# Patient Record
Sex: Male | Born: 1975 | Race: White | Hispanic: No | Marital: Married | Smoking: Former smoker
Health system: Southern US, Community
[De-identification: ages and names within clinical notes are randomized; demographics above are authoritative.]

## PROBLEM LIST (undated history)

## (undated) DIAGNOSIS — F419 Anxiety disorder, unspecified: Secondary | ICD-10-CM

## (undated) HISTORY — PX: MANDIBLE SURGERY: SHX707

## (undated) HISTORY — PX: FOOT SURGERY: SHX648

---

## 2006-11-29 ENCOUNTER — Emergency Department (HOSPITAL_COMMUNITY): Admission: EM | Admit: 2006-11-29 | Discharge: 2006-11-29 | Payer: Self-pay | Admitting: Emergency Medicine

## 2007-03-14 ENCOUNTER — Emergency Department: Payer: Self-pay | Admitting: Internal Medicine

## 2007-04-09 ENCOUNTER — Emergency Department: Payer: Self-pay | Admitting: Emergency Medicine

## 2007-04-09 ENCOUNTER — Other Ambulatory Visit: Payer: Self-pay

## 2008-04-16 IMAGING — CR DG CHEST 2V
1 series · 2 of 2 positions shown · non-contrast
Comparison: none

REASON FOR EXAM: chest pain
COMMENTS:

PROCEDURE:     DXR - DXR CHEST PA (OR AP) AND LATERAL  - March 14, 2007  [DATE]
RESULT:     The lungs are well expanded. There is no focal infiltrate. The
mediastinum is not widened. I see no pleural effusion and no acute bony
abnormality.

[Series 1: view not recorded · 0.17mm/px · 2 of 2 slices shown]
[im 1/2]
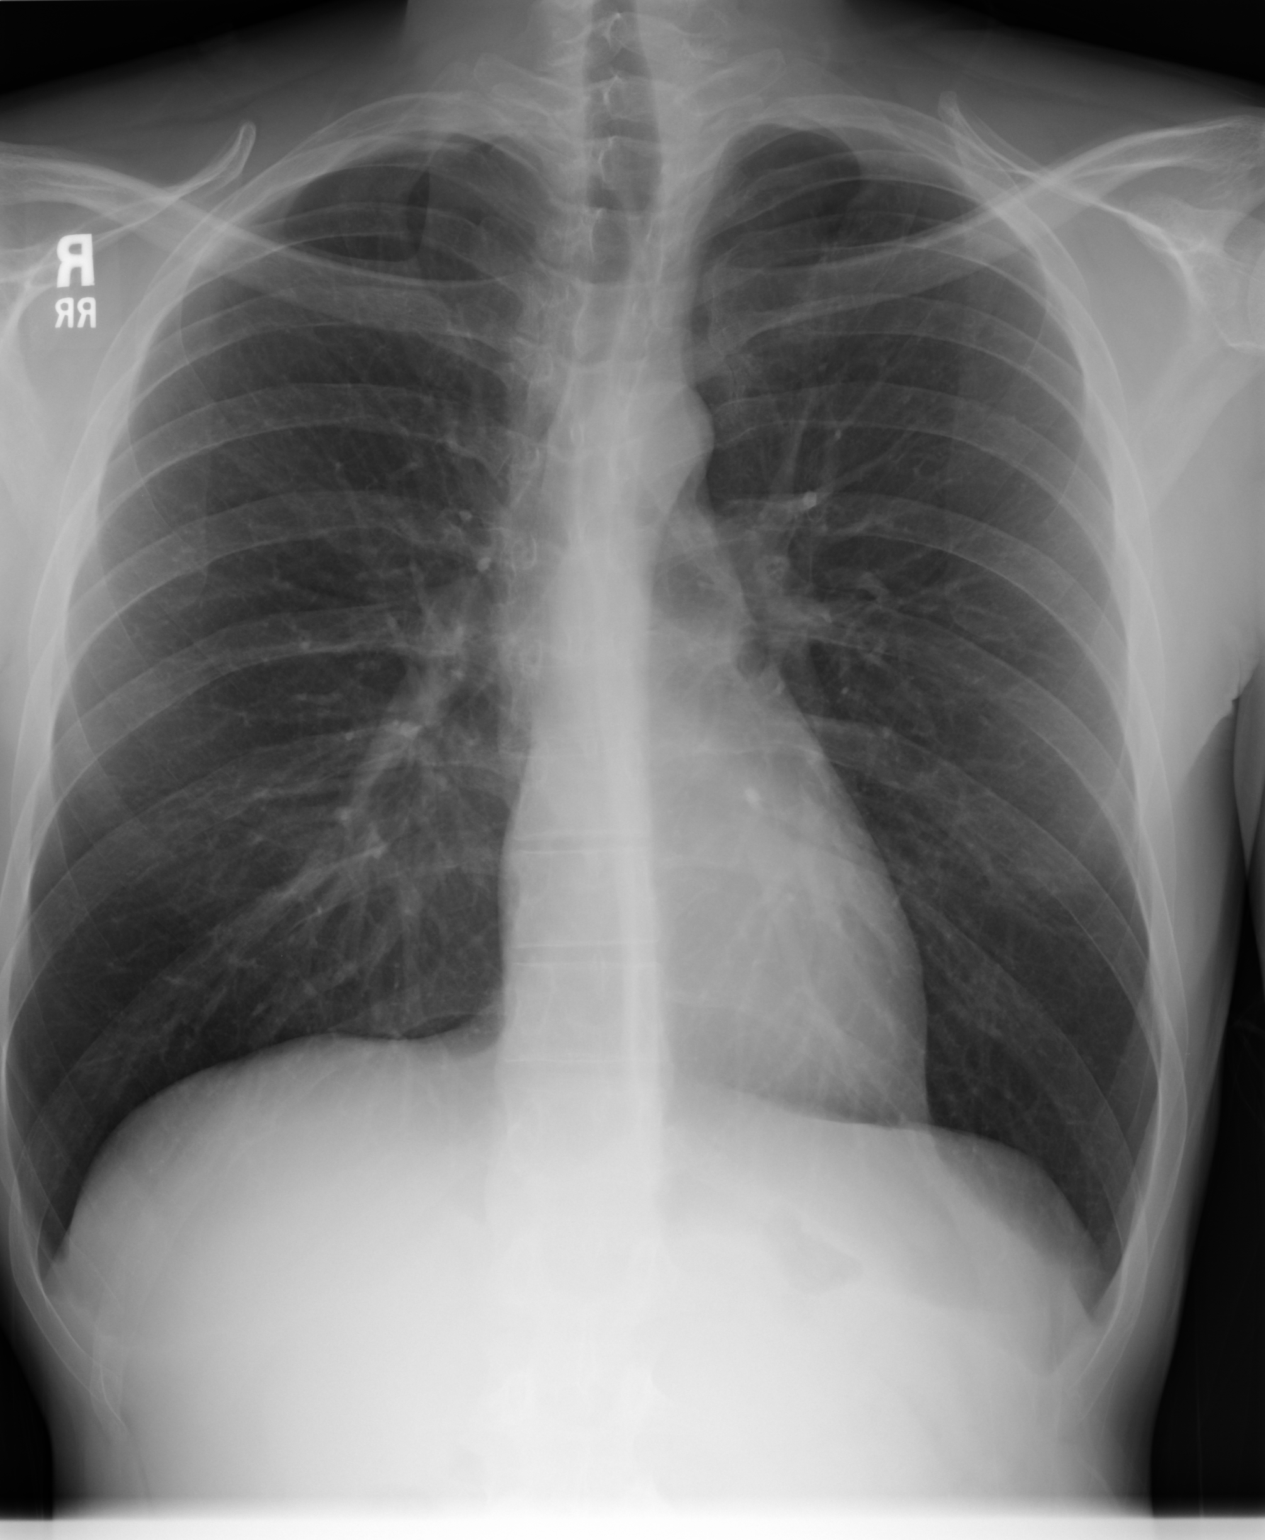
[im 2/2]
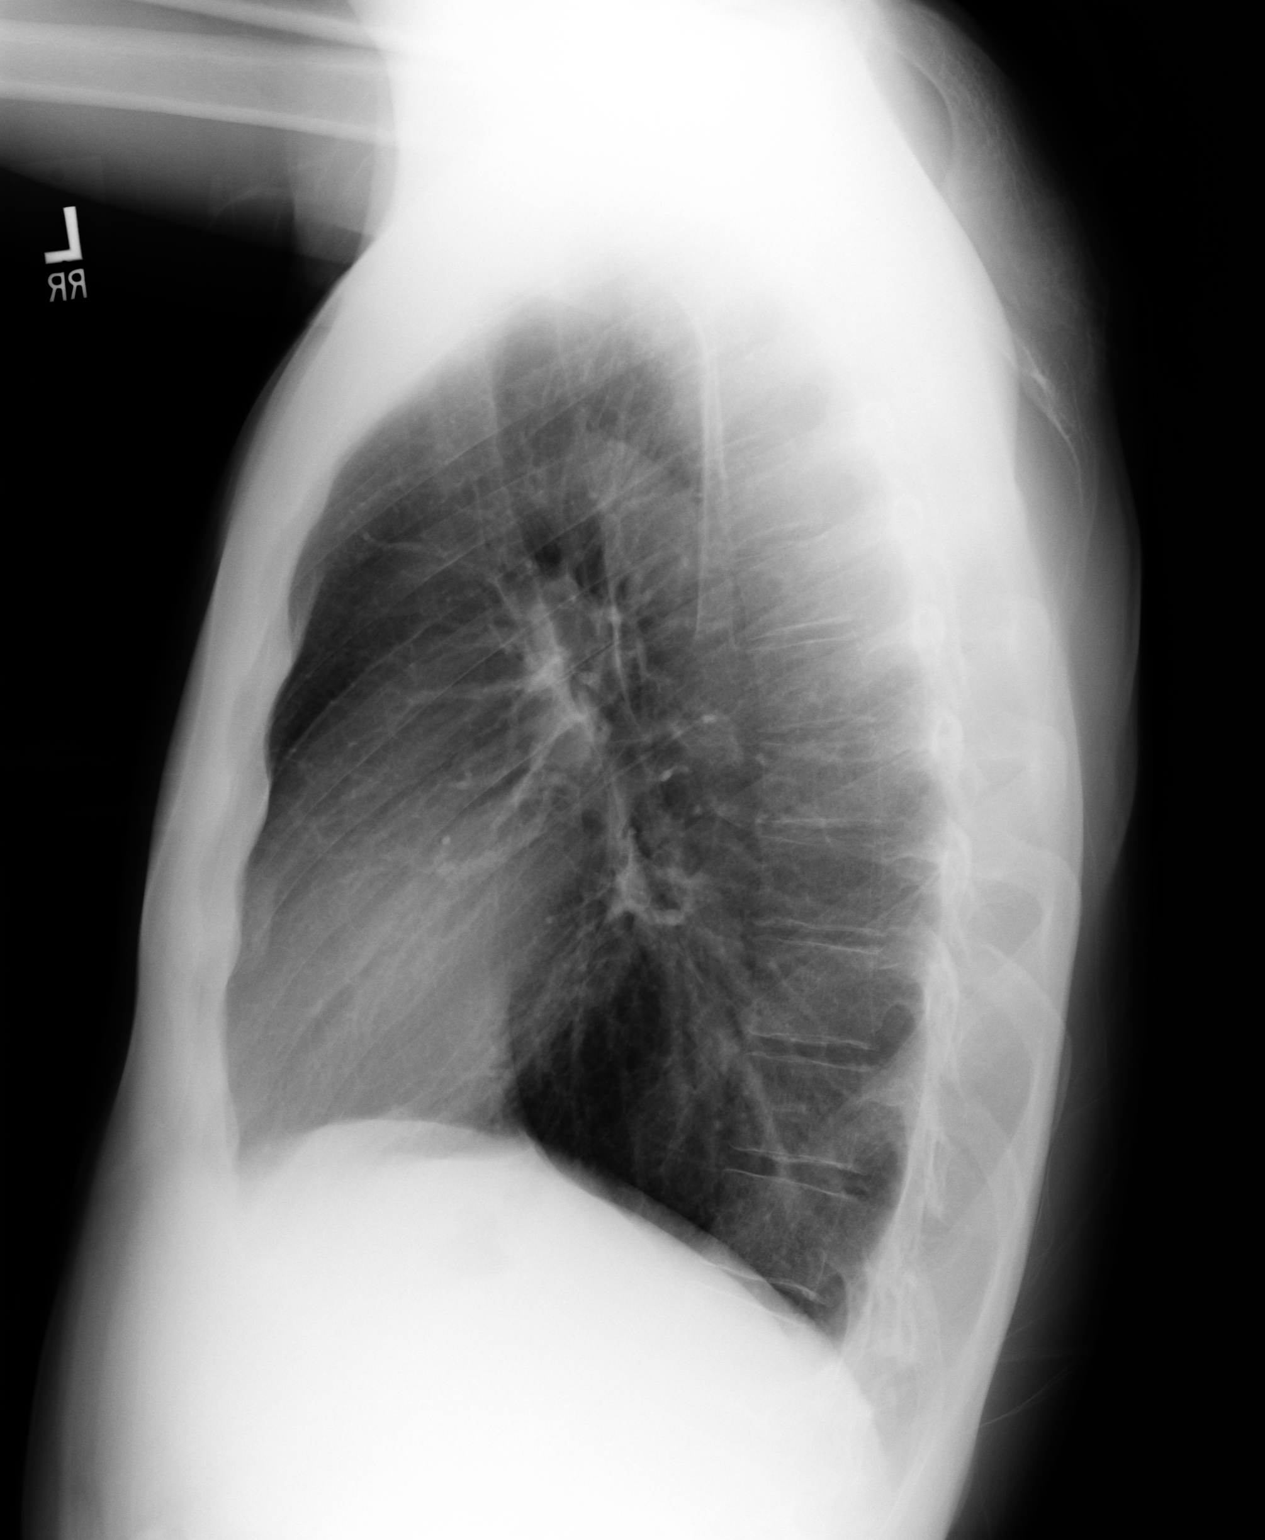

[2 of 2 positions shown; findings below may reference images not displayed]

IMPRESSION: 1.I do not see evidence of pneumonia nor pneumothorax or other acute
cardiopulmonary abnormality. If the patient's chest discomfort persists, a
follow-up CT scan may be of value.

## 2008-05-12 IMAGING — CR DG CHEST 2V
1 series · 2 of 2 positions shown · non-contrast
Comparison: none

REASON FOR EXAM: Chest pain
COMMENTS:   LMP: (Male)

[Series 1: view not recorded · 0.17mm/px · 2 of 2 slices shown]
[im 1/2]
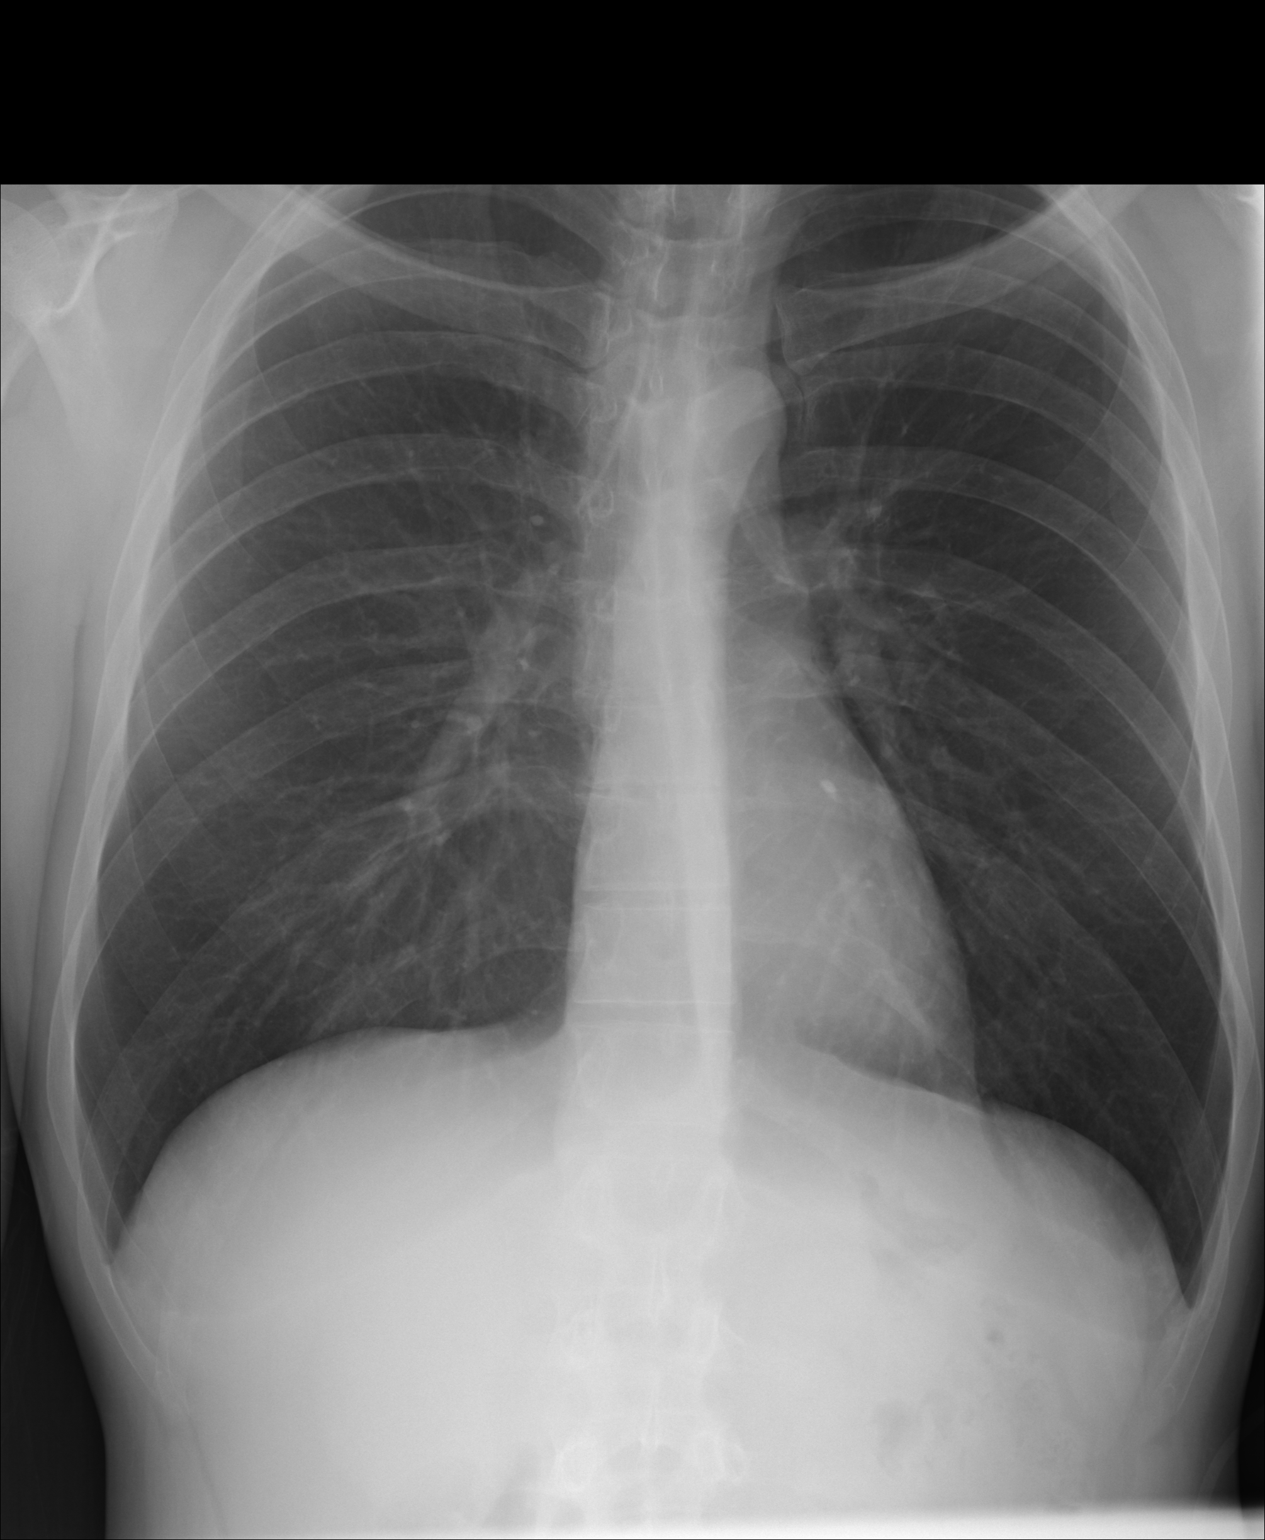
[im 2/2]
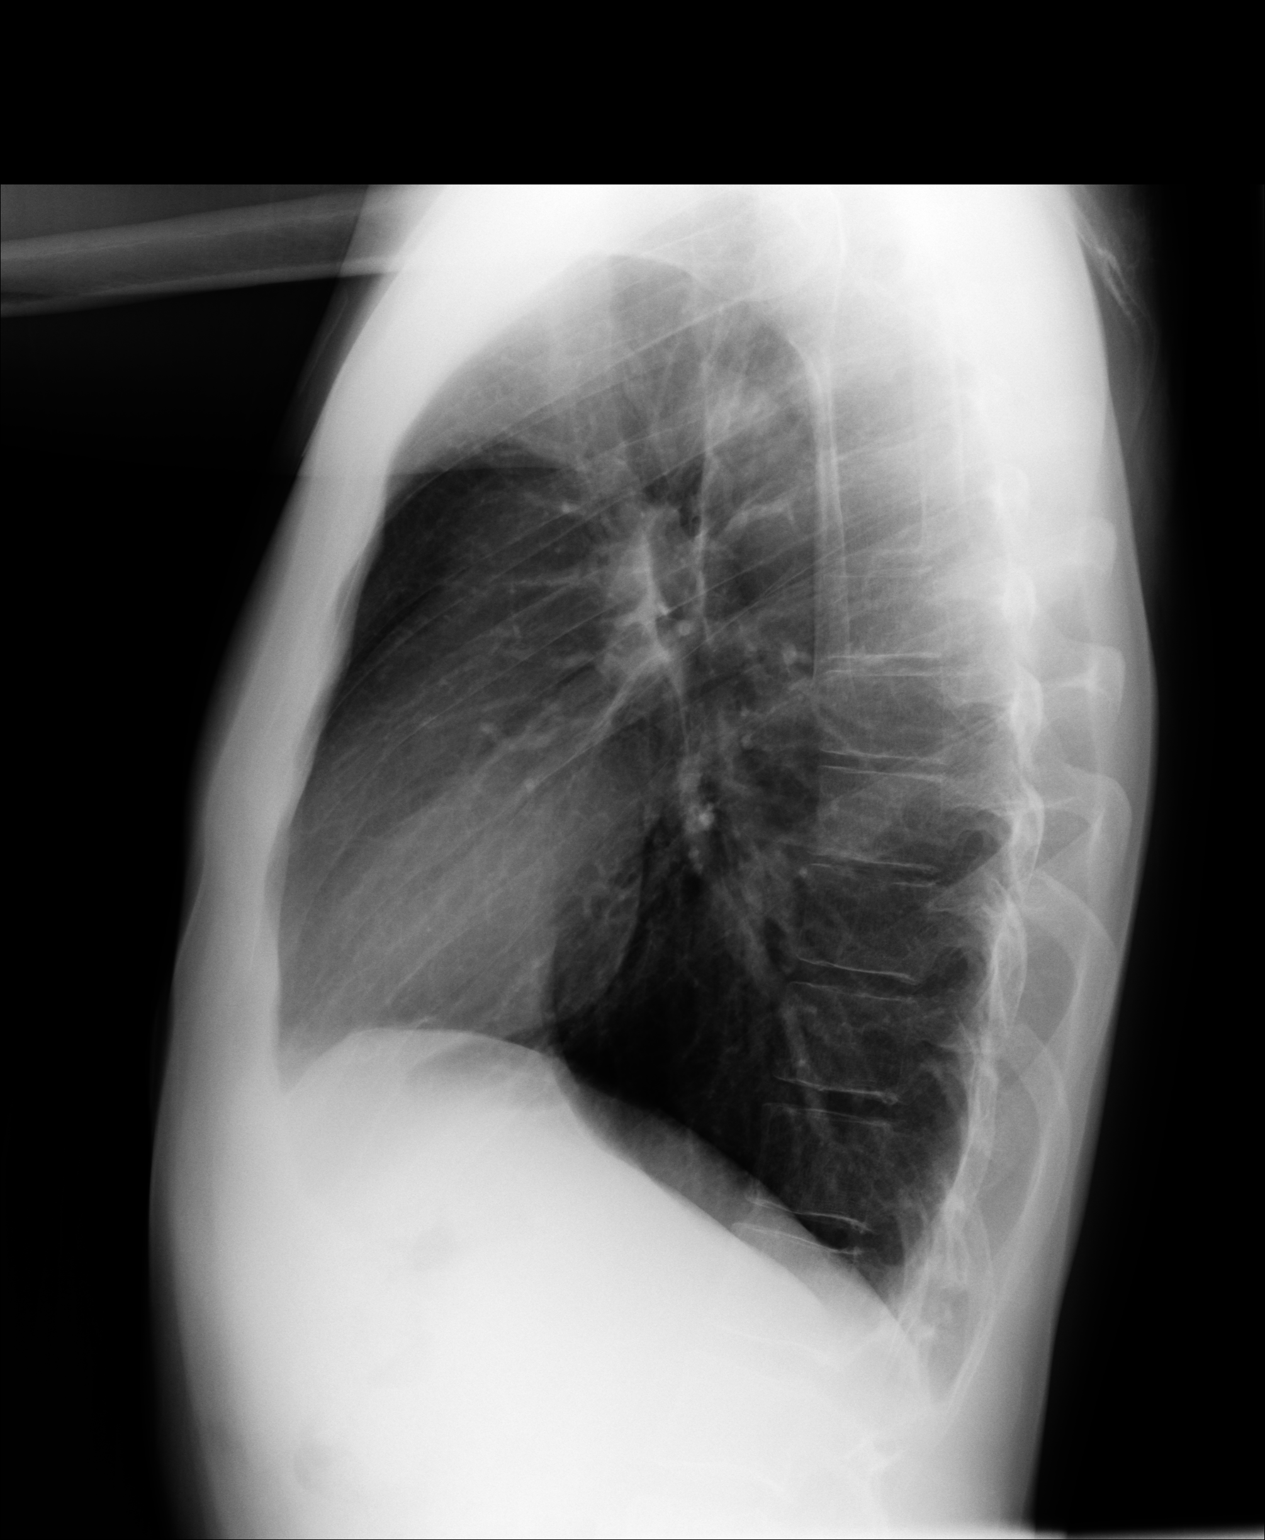

[2 of 2 positions shown; findings below may reference images not displayed]

PROCEDURE:     DXR - DXR CHEST PA (OR AP) AND LATERAL  - April 09, 2007  [DATE]

RESULT:     Comparison is made to a study of 03/14/2007.

The lungs are mildly hyperinflated. There is no focal infiltrate. There is
no evidence of pneumothorax or pleural effusion. The cardiac silhouette and
pulmonary vascularity are normal in appearance.
IMPRESSION: I do not see evidence of acute cardiopulmonary disease.
There has not been significant change since the study in March 2007.  As
noted earlier, chest CT scanning may be a useful next step.

## 2018-03-21 ENCOUNTER — Emergency Department (HOSPITAL_COMMUNITY)
Admission: EM | Admit: 2018-03-21 | Discharge: 2018-03-22 | Disposition: A | Discharge status: Home / Self Care | Payer: Self-pay | Attending: Emergency Medicine | Admitting: Emergency Medicine

## 2018-03-21 ENCOUNTER — Other Ambulatory Visit: Payer: Self-pay

## 2018-03-21 ENCOUNTER — Encounter (HOSPITAL_COMMUNITY): Payer: Self-pay | Admitting: Emergency Medicine

## 2018-03-21 DIAGNOSIS — F10230 Alcohol dependence with withdrawal, uncomplicated: Secondary | ICD-10-CM | POA: Insufficient documentation

## 2018-03-21 DIAGNOSIS — F10930 Alcohol use, unspecified with withdrawal, uncomplicated: Secondary | ICD-10-CM

## 2018-03-21 HISTORY — DX: Anxiety disorder, unspecified: F41.9

## 2018-03-21 LAB — CBC
HCT: 48.8 % (ref 39.0–52.0)
Hemoglobin: 17.4 g/dL — ABNORMAL HIGH (ref 13.0–17.0)
MCH: 33.6 pg (ref 26.0–34.0)
MCHC: 35.7 g/dL (ref 30.0–36.0)
MCV: 94.2 fL (ref 78.0–100.0)
Platelets: 230 10*3/uL (ref 150–400)
RBC: 5.18 MIL/uL (ref 4.22–5.81)
RDW: 12.4 % (ref 11.5–15.5)
WBC: 6.6 10*3/uL (ref 4.0–10.5)

## 2018-03-21 LAB — URINALYSIS, ROUTINE W REFLEX MICROSCOPIC
BACTERIA UA: NONE SEEN
BILIRUBIN URINE: NEGATIVE
GLUCOSE, UA: NEGATIVE mg/dL
KETONES UR: 20 mg/dL — AB
LEUKOCYTES UA: NEGATIVE
NITRITE: NEGATIVE
PH: 6 (ref 5.0–8.0)
Protein, ur: 30 mg/dL — AB
SPECIFIC GRAVITY, URINE: 1.008 (ref 1.005–1.030)
Squamous Epithelial / LPF: NONE SEEN

## 2018-03-21 LAB — COMPREHENSIVE METABOLIC PANEL
ALBUMIN: 4.8 g/dL (ref 3.5–5.0)
ALK PHOS: 54 U/L (ref 38–126)
ALT: 138 U/L — AB (ref 17–63)
AST: 233 U/L — AB (ref 15–41)
Anion gap: 21 — ABNORMAL HIGH (ref 5–15)
BILIRUBIN TOTAL: 1.8 mg/dL — AB (ref 0.3–1.2)
BUN: 9 mg/dL (ref 6–20)
CALCIUM: 9.8 mg/dL (ref 8.9–10.3)
CO2: 25 mmol/L (ref 22–32)
Chloride: 86 mmol/L — ABNORMAL LOW (ref 101–111)
Creatinine, Ser: 0.9 mg/dL (ref 0.61–1.24)
GFR calc Af Amer: >60 mL/min (ref >60)
GLUCOSE: 105 mg/dL — AB (ref 65–99)
Potassium: 4.1 mmol/L (ref 3.5–5.1)
Sodium: 132 mmol/L — ABNORMAL LOW (ref 135–145)
TOTAL PROTEIN: 8.5 g/dL — AB (ref 6.5–8.1)

## 2018-03-21 LAB — LIPASE, BLOOD: Lipase: 32 U/L (ref 11–51)

## 2018-03-21 MED ORDER — SODIUM CHLORIDE 0.9 % IV BOLUS (SEPSIS)
1000.0000 mL | Freq: Once | INTRAVENOUS | Status: AC
  Administered 2018-03-21: 1000 mL via INTRAVENOUS

## 2018-03-21 MED ORDER — LORAZEPAM 2 MG/ML IJ SOLN
1.0000 mg | Freq: Once | INTRAMUSCULAR | Status: AC
  Administered 2018-03-21: 1 mg via INTRAVENOUS
  Filled 2018-03-21: qty 1

## 2018-03-21 MED ORDER — PROMETHAZINE HCL 25 MG/ML IJ SOLN
25.0000 mg | Freq: Once | INTRAMUSCULAR | Status: AC
  Administered 2018-03-21: 25 mg via INTRAVENOUS
  Filled 2018-03-21: qty 1

## 2018-03-21 MED ORDER — CHLORDIAZEPOXIDE HCL 25 MG PO CAPS: ORAL_CAPSULE | ORAL | 0 refills | Status: DC

## 2018-03-21 MED ORDER — ONDANSETRON 4 MG PO TBDP
4.0000 mg | ORAL_TABLET | Freq: Once | ORAL | Status: AC | PRN
  Administered 2018-03-21: 4 mg via ORAL
  Filled 2018-03-21: qty 1

## 2018-03-21 MED ORDER — SODIUM CHLORIDE 0.9 % IV SOLN: 1000.0000 mL | INTRAVENOUS | Status: DC

## 2018-03-21 MED ORDER — PROMETHAZINE HCL 25 MG PO TABS: 25.0000 mg | ORAL_TABLET | Freq: Four times a day (QID) | ORAL | 0 refills | Status: DC | PRN

## 2018-03-21 NOTE — Discharge Instructions (Addendum)
Please review the discharge instructions to help you find a treatment center, follow up with a primary care doctor in 1-2 weeks to be rechecked, return as needed for worsening symptoms

## 2018-03-21 NOTE — ED Triage Notes (Signed)
Pt reports he has anxiety and tried began to drink more alcohol X4 days.  He has been unable to keep anything down for 2 days despite OTC medications.  Pt reports he is dehydrated.  Last drink today

## 2018-03-21 NOTE — ED Provider Notes (Signed)
[facility] EMERGENCY DEPARTMENT Provider Note   CSN: [id number] Arrival date & time: 03/21/18  1919     History   Chief Complaint Chief Complaint  Patient presents with  . Emesis  . Dehydration    HPI Seth Chavez is a 42 y.o. male.  HPI Pt has been having nausea and vomiting for the last several days.  He has tried over the counter mediations but it has not helped.  Pt drinks alcohol daily.  Pt usually drinks a few beers per day but started drinking more.   Pt starts to get shaky if he stops drinking alcohol .  No diarrhea.  No fever.    No abdominal pain but his throat is sore from the vomiting. Past Medical History:  Diagnosis Date  . Anxiety       History reviewed. No pertinent surgical history.      Home Medications    Prior to Admission medications   Medication Sig Start Date End Date Taking? Authorizing Provider  chlordiazePOXIDE (LIBRIUM) 25 MG capsule 50mg  PO TID x 1D, then 25-50mg  PO BID X 1D, then 25-50mg  PO QD X 1D 03/21/18   Linwood Dibbles, MD  promethazine (PHENERGAN) 25 MG tablet Take 1 tablet (25 mg total) by mouth every 6 (six) hours as needed for nausea or vomiting. 03/21/18   Linwood Dibbles, MD    Family History History reviewed. No pertinent family history.  Social History Social History   Tobacco Use  . Smoking status: Not on file  Substance Use Topics  . Alcohol use: Yes    Comment: daily drinker  . Drug use: Not on file     Allergies   Patient has no known allergies.   Review of Systems Review of Systems  All other systems reviewed and are negative.    Physical Exam Updated Vital Signs BP (!) 138/95   Pulse (!) 105   Temp 98.7 F (37.1 C) (Oral)   Resp 15   Ht 1.778 m (5\' 10" )   Wt 63.5 kg (140 lb)   SpO2 94%   BMI 20.09 kg/m   Physical Exam  Constitutional: He appears well-developed and well-nourished. No distress.  HENT:  Head: Normocephalic and atraumatic.  Right Ear: External ear normal.  Left  Ear: External ear normal.  Eyes: Conjunctivae are normal. Right eye exhibits no discharge. Left eye exhibits no discharge. No scleral icterus.  Neck: Neck supple. No tracheal deviation present.  Cardiovascular: Regular rhythm and intact distal pulses. Tachycardia present.  Pulmonary/Chest: Effort normal and breath sounds normal. No stridor. No respiratory distress. He has no wheezes. He has no rales.  Abdominal: Soft. Bowel sounds are normal. He exhibits no distension. There is no tenderness. There is no rebound and no guarding.  Musculoskeletal: He exhibits no edema or tenderness.  Neurological: He is alert. He has normal strength. He displays tremor. No cranial nerve deficit (no facial droop, extraocular movements intact, no slurred speech) or sensory deficit. He exhibits normal muscle tone. He displays no seizure activity. Coordination normal.  Skin: Skin is warm and dry. No rash noted.  Psychiatric: He has a normal mood and affect.  Nursing note and vitals reviewed.    ED Treatments / Results  Labs (all labs ordered are listed, but only abnormal results are displayed) Labs Reviewed  COMPREHENSIVE METABOLIC PANEL - Abnormal; Notable for the following components:      Result Value   Sodium 132 (*)    Chloride 86 (*)  Glucose, Bld 105 (*)    Total Protein 8.5 (*)    AST 233 (*)    ALT 138 (*)    Total Bilirubin 1.8 (*)    Anion gap 21 (*)    All other components within normal limits  CBC - Abnormal; Notable for the following components:   Hemoglobin 17.4 (*)    All other components within normal limits  URINALYSIS, ROUTINE W REFLEX MICROSCOPIC - Abnormal; Notable for the following components:   Hgb urine dipstick SMALL (*)    Ketones, ur 20 (*)    Protein, ur 30 (*)    All other components within normal limits  LIPASE, BLOOD    Procedures Procedures (including critical care time)  Medications Ordered in ED Medications  sodium chloride 0.9 % bolus 1,000 mL (0 mLs  Intravenous Stopped 03/21/18 2328)    Followed by  sodium chloride 0.9 % bolus 1,000 mL (1,000 mLs Intravenous New Bag/Given 03/21/18 2328)    Followed by  0.9 %  sodium chloride infusion (has no administration in time range)  ondansetron (ZOFRAN-ODT) disintegrating tablet 4 mg (4 mg Oral Given 03/21/18 1933)  LORazepam (ATIVAN) injection 1 mg (1 mg Intravenous Given 03/21/18 2251)  promethazine (PHENERGAN) injection 25 mg (25 mg Intravenous Given 03/21/18 2251)     Initial Impression / Assessment and Plan / ED Course  I have reviewed the triage vital signs and the nursing notes.  Pertinent labs & imaging results that were available during my care of the patient were reviewed by me and considered in my medical decision making (see chart for details).  Clinical Course as of Mar 21 2353  Sat Mar 21, 2018  2216 Low  Chloride(!): 86 [JK]  2216 LFTs elevated  Comprehensive metabolic panel(!) [JK]    Clinical Course User Index [JK] Linwood DibblesKnapp, Quincey Nored, MD    Patient presented to the emergency room for evaluation of nausea vomiting associated with chronic alcohol use.  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  Patient was treated with IV fluids and benzodiazepines.  Patient's symptoms have improved significantly.  He is no longer tremulous.  He is tolerating fluids.  We discussed outpatient treatment for alcohol detox.  Plan on discharge home with prescription for Phenergan and Librium to help him with his withdrawal symptoms.  Warning signs and precautions discussed. Final Clinical Impressions(s) / ED Diagnoses   Final diagnoses:  Alcohol withdrawal syndrome without complication [facility](H[facility])    ED Discharge Orders        Ordered    promethazine (PHENERGAN) 25 MG tablet  Every 6 hours PRN     03/21/18 2347    chlordiazePOXIDE (LIBRIUM) 25 MG capsule     03/21/18 2347       Linwood DibblesKnapp, Grenda Lora, MD 03/21/18 2355

## 2018-06-10 ENCOUNTER — Encounter (HOSPITAL_COMMUNITY): Payer: Self-pay | Admitting: Emergency Medicine

## 2018-06-10 ENCOUNTER — Other Ambulatory Visit: Payer: Self-pay

## 2018-06-10 ENCOUNTER — Emergency Department (HOSPITAL_COMMUNITY)
Admission: EM | Admit: 2018-06-10 | Discharge: 2018-06-11 | Disposition: A | Discharge status: Home / Self Care | Payer: Managed Care, Other (non HMO) | Attending: Emergency Medicine | Admitting: Emergency Medicine

## 2018-06-10 DIAGNOSIS — Z79899 Other long term (current) drug therapy: Secondary | ICD-10-CM | POA: Diagnosis not present

## 2018-06-10 DIAGNOSIS — R112 Nausea with vomiting, unspecified: Secondary | ICD-10-CM | POA: Diagnosis present

## 2018-06-10 DIAGNOSIS — F1010 Alcohol abuse, uncomplicated: Secondary | ICD-10-CM | POA: Diagnosis not present

## 2018-06-10 DIAGNOSIS — F419 Anxiety disorder, unspecified: Secondary | ICD-10-CM | POA: Diagnosis not present

## 2018-06-10 DIAGNOSIS — R1115 Cyclical vomiting syndrome unrelated to migraine: Secondary | ICD-10-CM

## 2018-06-10 LAB — COMPREHENSIVE METABOLIC PANEL
ALT: 174 U/L — ABNORMAL HIGH (ref 0–44)
ANION GAP: 20 — AB (ref 5–15)
AST: 207 U/L — ABNORMAL HIGH (ref 15–41)
Albumin: 4.4 g/dL (ref 3.5–5.0)
Alkaline Phosphatase: 43 U/L (ref 38–126)
BILIRUBIN TOTAL: 1.3 mg/dL — AB (ref 0.3–1.2)
BUN: 6 mg/dL (ref 6–20)
CO2: 23 mmol/L (ref 22–32)
Calcium: 9.4 mg/dL (ref 8.9–10.3)
Chloride: 91 mmol/L — ABNORMAL LOW (ref 98–111)
Creatinine, Ser: 0.92 mg/dL (ref 0.61–1.24)
Glucose, Bld: 134 mg/dL — ABNORMAL HIGH (ref 70–99)
POTASSIUM: 4 mmol/L (ref 3.5–5.1)
Sodium: 134 mmol/L — ABNORMAL LOW (ref 135–145)
TOTAL PROTEIN: 7.8 g/dL (ref 6.5–8.1)

## 2018-06-10 LAB — CBC
HEMATOCRIT: 50.2 % (ref 39.0–52.0)
HEMOGLOBIN: 17.2 g/dL — AB (ref 13.0–17.0)
MCH: 33 pg (ref 26.0–34.0)
MCHC: 34.3 g/dL (ref 30.0–36.0)
MCV: 96.2 fL (ref 78.0–100.0)
Platelets: 197 10*3/uL (ref 150–400)
RBC: 5.22 MIL/uL (ref 4.22–5.81)
RDW: 11.6 % (ref 11.5–15.5)
WBC: 6.3 10*3/uL (ref 4.0–10.5)

## 2018-06-10 LAB — URINALYSIS, ROUTINE W REFLEX MICROSCOPIC
BILIRUBIN URINE: NEGATIVE
Glucose, UA: NEGATIVE mg/dL
HGB URINE DIPSTICK: NEGATIVE
Ketones, ur: 15 mg/dL — AB
Leukocytes, UA: NEGATIVE
Nitrite: NEGATIVE
PROTEIN: NEGATIVE mg/dL
Specific Gravity, Urine: <1.005 — ABNORMAL LOW (ref 1.005–1.030)
pH: 5.5 (ref 5.0–8.0)

## 2018-06-10 LAB — LIPASE, BLOOD: Lipase: 32 U/L (ref 11–51)

## 2018-06-10 MED ORDER — ONDANSETRON 4 MG PO TBDP
4.0000 mg | ORAL_TABLET | Freq: Once | ORAL | Status: AC | PRN
  Administered 2018-06-10: 4 mg via ORAL
  Filled 2018-06-10: qty 1

## 2018-06-10 MED ORDER — LORAZEPAM 2 MG/ML IJ SOLN
0.5000 mg | Freq: Once | INTRAMUSCULAR | Status: AC
  Administered 2018-06-10: 0.5 mg via INTRAVENOUS
  Filled 2018-06-10: qty 1

## 2018-06-10 MED ORDER — SODIUM CHLORIDE 0.9 % IV BOLUS
1000.0000 mL | Freq: Once | INTRAVENOUS | Status: AC
  Administered 2018-06-10: 1000 mL via INTRAVENOUS

## 2018-06-10 MED ORDER — ONDANSETRON HCL 4 MG/2ML IJ SOLN
4.0000 mg | Freq: Once | INTRAMUSCULAR | Status: AC
  Administered 2018-06-10: 4 mg via INTRAVENOUS
  Filled 2018-06-10: qty 2

## 2018-06-10 NOTE — ED Notes (Signed)
Results reviewed.  No changes in acuity at this time 

## 2018-06-10 NOTE — ED Provider Notes (Signed)
[facility] EMERGENCY DEPARTMENT Provider Note   CSN: [id number] Arrival date & time: 06/10/18  1832     History   Chief Complaint Chief Complaint  Patient presents with  . Emesis    HPI Seth Chavez is a 42 y.o. male.  HPI   Seth Chavez is a 42 y.o. male, with a history of anxiety, presenting to the ED with nausea and vomiting for last two days. Has had four episodes of nonbloody, nonbilious emesis in last 24 hours.   States he will have these symptoms when he drinks too much, especially if he starts drinking hard liquor, which he did two days ago. Drinks about 2-6 beers a day. Last beer was around 4-5pm on 7/10. Denies illicit drug use.  Denies fever/chills, diarrhea, abdominal pain, chest pain, SOB, seizures, urinary symptoms, or any other complaints.      Past Medical History:  Diagnosis Date  . Anxiety     There are no active problems to display for this patient.   History reviewed. No pertinent surgical history.      Home Medications    Prior to Admission medications   Medication Sig Start Date End Date Taking? Authorizing Provider  chlordiazePOXIDE (LIBRIUM) 25 MG capsule 50mg  PO TID x 1D, then 25-50mg  PO BID X 1D, then 25-50mg  PO QD X 1D 06/11/18   Joy, Shawn C, PA-C  promethazine (PHENERGAN) 25 MG tablet Take 1 tablet (25 mg total) by mouth every 6 (six) hours as needed for nausea or vomiting. 06/11/18   Joy, Hillard Danker, PA-C    Family History History reviewed. No pertinent family history.  Social History Social History   Tobacco Use  . Smoking status: Never Smoker  . Smokeless tobacco: Never Used  Substance Use Topics  . Alcohol use: Yes    Comment: daily drinker  . Drug use: Never     Allergies   Patient has no known allergies.   Review of Systems Review of Systems  Constitutional: Negative for chills, diaphoresis and fever.  Respiratory: Negative for shortness of breath.   Cardiovascular: Negative for chest pain.    Gastrointestinal: Positive for nausea and vomiting. Negative for abdominal pain, blood in stool and diarrhea.  Genitourinary: Negative for dysuria and hematuria.  Neurological: Negative for dizziness, tremors, seizures, syncope, weakness, light-headedness, numbness and headaches.  All other systems reviewed and are negative.    Physical Exam Updated Vital Signs BP (!) 132/94 (BP Location: Right Arm)   Pulse 97   Temp 98.6 F (37 C) (Oral)   Resp 16   Ht 5\' 10"  (1.778 m)   Wt 63.5 kg (140 lb)   SpO2 95%   BMI 20.09 kg/m   Physical Exam  Constitutional: He is oriented to person, place, and time. He appears well-developed and well-nourished. No distress.  HENT:  Head: Normocephalic and atraumatic.  Eyes: Pupils are equal, round, and reactive to light. Conjunctivae and EOM are normal.  Neck: Neck supple.  Cardiovascular: Normal rate, regular rhythm, normal heart sounds and intact distal pulses.  Pulmonary/Chest: Effort normal and breath sounds normal. No respiratory distress.  Abdominal: Soft. There is no tenderness. There is no guarding.  Musculoskeletal: He exhibits no edema.  Lymphadenopathy:    He has no cervical adenopathy.  Neurological: He is alert and oriented to person, place, and time. He displays no tremor.  Sensation grossly intact to light touch in all four extremities. Strength 5/5 in all extremities. No gait disturbance. Coordination intact.  Cranial nerves III-XII grossly intact. No facial droop.   Skin: Skin is warm and dry. He is not diaphoretic.  Psychiatric: He has a normal mood and affect. His behavior is normal.  Nursing note and vitals reviewed.    ED Treatments / Results  Labs (all labs ordered are listed, but only abnormal results are displayed) Labs Reviewed  COMPREHENSIVE METABOLIC PANEL - Abnormal; Notable for the following components:      Result Value   Sodium 134 (*)    Chloride 91 (*)    Glucose, Bld 134 (*)    AST 207 (*)    ALT 174 (*)     Total Bilirubin 1.3 (*)    Anion gap 20 (*)    All other components within normal limits  CBC - Abnormal; Notable for the following components:   Hemoglobin 17.2 (*)    All other components within normal limits  URINALYSIS, ROUTINE W REFLEX MICROSCOPIC - Abnormal; Notable for the following components:   Specific Gravity, Urine <1.005 (*)    Ketones, ur 15 (*)    All other components within normal limits  RAPID URINE DRUG SCREEN, HOSP PERFORMED - Abnormal; Notable for the following components:   Barbiturates   (*)    Value: Result not available. Reagent lot number recalled by manufacturer.   All other components within normal limits  ETHANOL - Abnormal; Notable for the following components:   Alcohol, Ethyl (B) 358 (*)    All other components within normal limits  LIPASE, BLOOD  HEPATITIS PANEL, ACUTE    EKG None  Radiology No results found.  Procedures Procedures (including critical care time)  Medications Ordered in ED Medications  ondansetron (ZOFRAN-ODT) disintegrating tablet 4 mg (4 mg Oral Given 06/10/18 1847)  sodium chloride 0.9 % bolus 1,000 mL (0 mLs Intravenous Stopped 06/11/18 0251)  ondansetron (ZOFRAN) injection 4 mg (4 mg Intravenous Given 06/10/18 2330)  LORazepam (ATIVAN) injection 0.5 mg (0.5 mg Intravenous Given 06/10/18 2330)  promethazine (PHENERGAN) tablet 12.5 mg (12.5 mg Oral Given 06/11/18 0200)     Initial Impression / Assessment and Plan / ED Course  I have reviewed the triage vital signs and the nursing notes.  Pertinent labs & imaging results that were available during my care of the patient were reviewed by me and considered in my medical decision making (see chart for details).  Clinical Course as of Jun 12 515  Thu Jun 11, 2018  0155 Patient states he continues to feel nauseous, but this has improved.   [SJ]    Clinical Course User Index [SJ] Joy, Shawn C, PA-C    Patient presents with nausea and vomiting.  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  Abdominal exam benign.  Some lab abnormalities consistent with dehydration.  Transaminase elevations consistent with previous values.  We were able to obtain good control of patient's nausea.  He was able to tolerate PO. The patient was given instructions for home care as well as return precautions. Patient voices understanding of these instructions, accepts the plan, and is comfortable with discharge.  Findings and plan of care discussed with Zadie Rhine, MD.   Vitals:   06/11/18 0300 06/11/18 0330 06/11/18 0345 06/11/18 0445  BP: (!) 125/91 115/83 115/83 119/88  Pulse: (!) 107 90 89 97  Resp:      Temp:      TempSrc:      SpO2: 94% 94% 96% 96%  Weight:      Height:  Final Clinical Impressions(s) / ED Diagnoses   Final diagnoses:  Non-intractable cyclical vomiting with nausea    ED Discharge Orders        Ordered    chlordiazePOXIDE (LIBRIUM) 25 MG capsule     06/11/18 0359    promethazine (PHENERGAN) 25 MG tablet  Every 6 hours PRN     06/11/18 0359       Anselm PancoastJoy, Shawn C, PA-C 06/11/18 0518    Zadie RhineWickline, Donald, MD 06/11/18 [phone]

## 2018-06-10 NOTE — ED Notes (Signed)
Pt stared at pill in cup for a bit before taking it.  This RN asked if patient did not want to take it.  Patient then states "may I have some water", and this RN explained again that tablet was meant to dissolve under his tongue.  Patient then took pill and swallowed whole.  Patient then asked for something for anxiety.  This RN explained that she was unable to give him anything in triage, but that he could speak to MD about it once roomed.  Patient verbalized understanding.

## 2018-06-10 NOTE — ED Triage Notes (Signed)
Pt presents to ED for assessment of nausea and emesis x 2 days, denies abdominal pain.  Pt states he has vomited 2 times in the last 24 hours.  States he is a daily drinker and has had four beers.  Pt states last drink 2 hours ago.  Patient c/o feeling dehydrated.   Pt c/o dizziness.  Denies any pain.  Denies diarrhea, denies urinary symptoms.

## 2018-06-11 LAB — RAPID URINE DRUG SCREEN, HOSP PERFORMED
Amphetamines: NOT DETECTED
Benzodiazepines: NOT DETECTED
COCAINE: NOT DETECTED
OPIATES: NOT DETECTED
TETRAHYDROCANNABINOL: NOT DETECTED

## 2018-06-11 LAB — ETHANOL: ALCOHOL ETHYL (B): 358 mg/dL — AB (ref <10)

## 2018-06-11 MED ORDER — PROMETHAZINE HCL 25 MG PO TABS: 25.0000 mg | ORAL_TABLET | Freq: Four times a day (QID) | ORAL | 0 refills | Status: DC | PRN

## 2018-06-11 MED ORDER — PROMETHAZINE HCL 25 MG PO TABS
12.5000 mg | ORAL_TABLET | Freq: Once | ORAL | Status: AC
  Administered 2018-06-11: 12.5 mg via ORAL
  Filled 2018-06-11: qty 1

## 2018-06-11 MED ORDER — CHLORDIAZEPOXIDE HCL 25 MG PO CAPS: ORAL_CAPSULE | ORAL | 0 refills | Status: DC

## 2018-06-11 NOTE — ED Notes (Signed)
Patient given PO fluids, tolerating well.  

## 2018-06-11 NOTE — ED Notes (Signed)
Pt discharged from ED; instructions provided and scripts given; Pt encouraged to return to ED if symptoms worsen and to f/u with PCP; Pt verbalized understanding of all instructions 

## 2018-06-11 NOTE — Discharge Instructions (Addendum)
Take the Librium taper, as directed, to avoid alcohol withdrawal symptoms. May use the promethazine, as needed, for nausea/vomiting. Follow-up with one of the resources on the attached list for alcohol recovery. Return to the ED as needed.

## 2018-06-12 LAB — HEPATITIS PANEL, ACUTE
HCV Ab: <0.1 s/co ratio (ref 0.0–0.9)
HEP B S AG: NEGATIVE
Hep A IgM: NEGATIVE
Hep B C IgM: NEGATIVE

## 2018-12-28 ENCOUNTER — Encounter (HOSPITAL_COMMUNITY): Payer: Self-pay

## 2018-12-28 ENCOUNTER — Other Ambulatory Visit: Payer: Self-pay

## 2018-12-28 ENCOUNTER — Emergency Department (HOSPITAL_COMMUNITY)
Admission: EM | Admit: 2018-12-28 | Discharge: 2018-12-28 | Discharge status: Against Medical Advice | Payer: Managed Care, Other (non HMO) | Attending: Emergency Medicine | Admitting: Emergency Medicine

## 2018-12-28 DIAGNOSIS — F1010 Alcohol abuse, uncomplicated: Secondary | ICD-10-CM | POA: Insufficient documentation

## 2018-12-28 DIAGNOSIS — Z79899 Other long term (current) drug therapy: Secondary | ICD-10-CM | POA: Diagnosis not present

## 2018-12-28 LAB — COMPREHENSIVE METABOLIC PANEL
ALK PHOS: 34 U/L — AB (ref 38–126)
ALT: 455 U/L — AB (ref 0–44)
AST: 445 U/L — AB (ref 15–41)
Albumin: 4.8 g/dL (ref 3.5–5.0)
Anion gap: 19 — ABNORMAL HIGH (ref 5–15)
BILIRUBIN TOTAL: 1.3 mg/dL — AB (ref 0.3–1.2)
BUN: 5 mg/dL — AB (ref 6–20)
CHLORIDE: 90 mmol/L — AB (ref 98–111)
CO2: 23 mmol/L (ref 22–32)
CREATININE: 0.74 mg/dL (ref 0.61–1.24)
Calcium: 9 mg/dL (ref 8.9–10.3)
GFR calc Af Amer: >60 mL/min (ref >60)
Glucose, Bld: 115 mg/dL — ABNORMAL HIGH (ref 70–99)
Potassium: 3.4 mmol/L — ABNORMAL LOW (ref 3.5–5.1)
Sodium: 132 mmol/L — ABNORMAL LOW (ref 135–145)
TOTAL PROTEIN: 7.9 g/dL (ref 6.5–8.1)

## 2018-12-28 LAB — RAPID URINE DRUG SCREEN, HOSP PERFORMED
Amphetamines: NOT DETECTED
Barbiturates: NOT DETECTED
Benzodiazepines: NOT DETECTED
Cocaine: NOT DETECTED
OPIATES: NOT DETECTED
TETRAHYDROCANNABINOL: NOT DETECTED

## 2018-12-28 LAB — LIPASE, BLOOD: Lipase: 42 U/L (ref 11–51)

## 2018-12-28 LAB — CBC
HCT: 45.8 % (ref 39.0–52.0)
HEMOGLOBIN: 15.9 g/dL (ref 13.0–17.0)
MCH: 33.6 pg (ref 26.0–34.0)
MCHC: 34.7 g/dL (ref 30.0–36.0)
MCV: 96.8 fL (ref 80.0–100.0)
Platelets: 176 10*3/uL (ref 150–400)
RBC: 4.73 MIL/uL (ref 4.22–5.81)
RDW: 11.7 % (ref 11.5–15.5)
WBC: 5.4 10*3/uL (ref 4.0–10.5)
nRBC: 0 % (ref 0.0–0.2)

## 2018-12-28 LAB — ETHANOL: ALCOHOL ETHYL (B): 435 mg/dL — AB (ref <10)

## 2018-12-28 LAB — ACETAMINOPHEN LEVEL: Acetaminophen (Tylenol), Serum: <10 ug/mL — ABNORMAL LOW (ref 10–30)

## 2018-12-28 LAB — SALICYLATE LEVEL: Salicylate Lvl: <7 mg/dL (ref 2.8–30.0)

## 2018-12-28 MED ORDER — THIAMINE HCL 100 MG/ML IJ SOLN: 100.0000 mg | Freq: Every day | INTRAMUSCULAR | Status: DC

## 2018-12-28 MED ORDER — ADULT MULTIVITAMIN W/MINERALS CH: 1.0000 | ORAL_TABLET | Freq: Every day | ORAL | Status: DC

## 2018-12-28 MED ORDER — FOLIC ACID 1 MG PO TABS: 1.0000 mg | ORAL_TABLET | Freq: Every day | ORAL | Status: DC

## 2018-12-28 MED ORDER — LORAZEPAM 1 MG PO TABS: 1.0000 mg | ORAL_TABLET | Freq: Four times a day (QID) | ORAL | Status: DC | PRN

## 2018-12-28 MED ORDER — VITAMIN B-1 100 MG PO TABS: 100.0000 mg | ORAL_TABLET | Freq: Every day | ORAL | Status: DC

## 2018-12-28 MED ORDER — LORAZEPAM 1 MG PO TABS
0.0000 mg | ORAL_TABLET | Freq: Four times a day (QID) | ORAL | Status: DC
  Administered 2018-12-28: 2 mg via ORAL
  Filled 2018-12-28: qty 2

## 2018-12-28 MED ORDER — LORAZEPAM 2 MG/ML IJ SOLN: 1.0000 mg | Freq: Four times a day (QID) | INTRAMUSCULAR | Status: DC | PRN

## 2018-12-28 MED ORDER — LORAZEPAM 1 MG PO TABS: 0.0000 mg | ORAL_TABLET | Freq: Two times a day (BID) | ORAL | Status: DC
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
## 2018-12-28 NOTE — ED Notes (Signed)
Wifes # [phone]

## 2018-12-28 NOTE — ED Notes (Signed)
Attempted lab draw x 2 but unsuccessful. 

## 2018-12-28 NOTE — ED Notes (Signed)
MD made aware of patient leaving.
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
## 2018-12-28 NOTE — ED Notes (Signed)
ED Provider at bedside. 

## 2018-12-28 NOTE — ED Notes (Signed)
Pt reports that he is leaving AMA and mentions an Pharmacist, community. Pt ambulated out of ED with a steady gait.

## 2018-12-28 NOTE — ED Notes (Signed)
Bed: WTR5 Expected date:  Expected time:  Means of arrival:  Comments: 

## 2018-12-30 ENCOUNTER — Other Ambulatory Visit: Payer: Self-pay

## 2018-12-30 ENCOUNTER — Emergency Department (HOSPITAL_COMMUNITY)
Admission: EM | Admit: 2018-12-30 | Discharge: 2018-12-30 | Disposition: A | Discharge status: Home / Self Care | Payer: Managed Care, Other (non HMO) | Attending: Emergency Medicine | Admitting: Emergency Medicine

## 2018-12-30 DIAGNOSIS — F1010 Alcohol abuse, uncomplicated: Secondary | ICD-10-CM

## 2018-12-30 DIAGNOSIS — F1019 Alcohol abuse with unspecified alcohol-induced disorder: Secondary | ICD-10-CM | POA: Diagnosis not present

## 2018-12-30 DIAGNOSIS — R7401 Elevation of levels of liver transaminase levels: Secondary | ICD-10-CM

## 2018-12-30 DIAGNOSIS — R110 Nausea: Secondary | ICD-10-CM | POA: Diagnosis present

## 2018-12-30 DIAGNOSIS — R740 Nonspecific elevation of levels of transaminase and lactic acid dehydrogenase [LDH]: Secondary | ICD-10-CM | POA: Diagnosis not present

## 2018-12-30 LAB — COMPREHENSIVE METABOLIC PANEL
ALT: 404 U/L — ABNORMAL HIGH (ref 0–44)
ANION GAP: 22 — AB (ref 5–15)
AST: 475 U/L — ABNORMAL HIGH (ref 15–41)
Albumin: 4.5 g/dL (ref 3.5–5.0)
Alkaline Phosphatase: 32 U/L — ABNORMAL LOW (ref 38–126)
BUN: 9 mg/dL (ref 6–20)
CALCIUM: 9.3 mg/dL (ref 8.9–10.3)
CO2: 23 mmol/L (ref 22–32)
Chloride: 86 mmol/L — ABNORMAL LOW (ref 98–111)
Creatinine, Ser: 0.72 mg/dL (ref 0.61–1.24)
GFR calc Af Amer: >60 mL/min (ref >60)
GFR calc non Af Amer: >60 mL/min (ref >60)
Glucose, Bld: 114 mg/dL — ABNORMAL HIGH (ref 70–99)
Potassium: 3.5 mmol/L (ref 3.5–5.1)
Sodium: 131 mmol/L — ABNORMAL LOW (ref 135–145)
Total Bilirubin: 1.3 mg/dL — ABNORMAL HIGH (ref 0.3–1.2)
Total Protein: 7.7 g/dL (ref 6.5–8.1)

## 2018-12-30 LAB — CBC WITH DIFFERENTIAL/PLATELET
Abs Immature Granulocytes: 0.02 10*3/uL (ref 0.00–0.07)
Basophils Absolute: 0 10*3/uL (ref 0.0–0.1)
Basophils Relative: 1 %
Eosinophils Absolute: 0 10*3/uL (ref 0.0–0.5)
Eosinophils Relative: 0 %
HCT: 44.4 % (ref 39.0–52.0)
Hemoglobin: 15.1 g/dL (ref 13.0–17.0)
Immature Granulocytes: 0 %
Lymphocytes Relative: 12 %
Lymphs Abs: 0.7 10*3/uL (ref 0.7–4.0)
MCH: 32.5 pg (ref 26.0–34.0)
MCHC: 34 g/dL (ref 30.0–36.0)
MCV: 95.5 fL (ref 80.0–100.0)
Monocytes Absolute: 0.4 10*3/uL (ref 0.1–1.0)
Monocytes Relative: 7 %
NEUTROS PCT: 80 %
Neutro Abs: 4.4 10*3/uL (ref 1.7–7.7)
Platelets: 166 10*3/uL (ref 150–400)
RBC: 4.65 MIL/uL (ref 4.22–5.81)
RDW: 11.5 % (ref 11.5–15.5)
WBC: 5.6 10*3/uL (ref 4.0–10.5)
nRBC: 0 % (ref 0.0–0.2)

## 2018-12-30 LAB — LIPASE, BLOOD: Lipase: 28 U/L (ref 11–51)

## 2018-12-30 LAB — ETHANOL: Alcohol, Ethyl (B): 65 mg/dL — ABNORMAL HIGH (ref <10)

## 2018-12-30 MED ORDER — ONDANSETRON HCL 4 MG/2ML IJ SOLN
4.0000 mg | Freq: Once | INTRAMUSCULAR | Status: AC
  Administered 2018-12-30: 4 mg via INTRAVENOUS
  Filled 2018-12-30: qty 2

## 2018-12-30 MED ORDER — CHLORDIAZEPOXIDE HCL 25 MG PO CAPS
50.0000 mg | ORAL_CAPSULE | Freq: Once | ORAL | Status: AC
  Administered 2018-12-30: 50 mg via ORAL
  Filled 2018-12-30: qty 2

## 2018-12-30 MED ORDER — LORAZEPAM 2 MG/ML IJ SOLN
2.0000 mg | Freq: Once | INTRAMUSCULAR | Status: AC
  Administered 2018-12-30: 2 mg via INTRAVENOUS
  Filled 2018-12-30: qty 1

## 2018-12-30 MED ORDER — LACTATED RINGERS IV BOLUS
1000.0000 mL | Freq: Once | INTRAVENOUS | Status: AC
  Administered 2018-12-30: 1000 mL via INTRAVENOUS

## 2018-12-30 MED ORDER — LORAZEPAM 2 MG/ML IJ SOLN
1.0000 mg | Freq: Once | INTRAMUSCULAR | Status: AC
  Administered 2018-12-30: 1 mg via INTRAVENOUS
  Filled 2018-12-30: qty 1

## 2018-12-30 MED ORDER — THIAMINE HCL 100 MG/ML IJ SOLN
100.0000 mg | Freq: Once | INTRAMUSCULAR | Status: AC
  Administered 2018-12-30: 100 mg via INTRAVENOUS
  Filled 2018-12-30: qty 2

## 2018-12-30 MED ORDER — ONDANSETRON 4 MG PO TBDP: 4.0000 mg | ORAL_TABLET | Freq: Three times a day (TID) | ORAL | 0 refills | Status: DC | PRN

## 2018-12-30 MED ORDER — CHLORDIAZEPOXIDE HCL 25 MG PO CAPS: ORAL_CAPSULE | ORAL | 0 refills | Status: DC

## 2018-12-30 NOTE — ED Provider Notes (Signed)
[facility] Provider Note   CSN: [id number] Arrival date & time: 12/30/18  1400     History   Chief Complaint Chief Complaint  Patient presents with  . Nausea  . Emesis    HPI Seth Chavez is a 43 y.o. male.  The history is provided by the patient. No language interpreter was used.  Emesis   Seth Chavez is a 43 y.o. male who presents to the Emergency Department complaining of vomiting, alcohol withdrawal. He has a history of alcohol abuse and heavily he drinks beer and liquor. He presents to the emergency department for one week of nausea and vomiting, two or more episodes of emesis daily. Emesis is brown and yellow in color. He denies any fevers, diarrhea, constipation, abdominal pain. He thinks he may have vomited a small amount of blood yesterday. No hematochezia or melena. He attempted to drink two beers today but probably threw them up. He states that he does want help with his alcohol use. He has not been in treatment before. He denies any SI, HI, hallucinations. He does feel clammy. Past Medical History:  Diagnosis Date  . Anxiety     There are no active problems to display for this patient.   No past surgical history on file.      Home Medications    Prior to Admission medications   Medication Sig Start Date End Date Taking? Authorizing Provider  chlordiazePOXIDE (LIBRIUM) 25 MG capsule '50mg'$  PO TID x 1D, then 25-'50mg'$  PO BID X 1D, then 25-'50mg'$  PO QD X 1D 12/30/18   Quintella Reichert, MD  ondansetron (ZOFRAN ODT) 4 MG disintegrating tablet Take 1 tablet (4 mg total) by mouth every 8 (eight) hours as needed for nausea or vomiting. 12/30/18   Quintella Reichert, MD  promethazine (PHENERGAN) 25 MG tablet Take 1 tablet (25 mg total) by mouth every 6 (six) hours as needed for nausea or vomiting. Patient not taking: Reported on 12/28/2018 06/11/18   Lorayne Bender, PA-C    Family History No family history on file.  Social  History Social History   Tobacco Use  . Smoking status: Never Smoker  . Smokeless tobacco: Never Used  Substance Use Topics  . Alcohol use: Yes    Comment: daily drinker  . Drug use: Never     Allergies   Patient has no known allergies.   Review of Systems Review of Systems  Gastrointestinal: Positive for vomiting.  All other systems reviewed and are negative.    Physical Exam Updated Vital Signs BP 125/86 (BP Location: Right Arm)   Pulse 100   Temp 98.5 F (36.9 C) (Oral)   Resp 16   Ht '5\' 10"'$  (1.778 m)   Wt 64.4 kg   SpO2 100%   BMI 20.37 kg/m   Physical Exam Vitals signs and nursing note reviewed.  Constitutional:      Appearance: He is well-developed.  HENT:     Head: Normocephalic and atraumatic.  Cardiovascular:     Rate and Rhythm: Normal rate and regular rhythm.     Heart sounds: No murmur.  Pulmonary:     Effort: Pulmonary effort is normal. No respiratory distress.     Breath sounds: Normal breath sounds.  Abdominal:     Palpations: Abdomen is soft.     Tenderness: There is no abdominal tenderness. There is no guarding or rebound.  Musculoskeletal:        General: No tenderness.  Skin:  General: Skin is warm and dry.     Capillary Refill: Capillary refill takes less than 2 seconds.  Neurological:     Mental Status: He is alert and oriented to person, place, and time.  Psychiatric:     Comments: Mildly anxious and tremulous      ED Treatments / Results  Labs (all labs ordered are listed, but only abnormal results are displayed) Labs Reviewed  COMPREHENSIVE METABOLIC PANEL - Abnormal; Notable for the following components:      Result Value   Sodium 131 (*)    Chloride 86 (*)    Glucose, Bld 114 (*)    AST 475 (*)    ALT 404 (*)    Alkaline Phosphatase 32 (*)    Total Bilirubin 1.3 (*)    Anion gap 22 (*)    All other components within normal limits  ETHANOL - Abnormal; Notable for the following components:   Alcohol, Ethyl (B)  65 (*)    All other components within normal limits  LIPASE, BLOOD  CBC WITH DIFFERENTIAL/PLATELET  URINALYSIS, ROUTINE W REFLEX MICROSCOPIC    EKG None  Radiology No results found.  Procedures Procedures (including critical care time)  Medications Ordered in ED Medications  lactated ringers bolus 1,000 mL (1,000 mLs Intravenous New Bag/Given 12/30/18 1813)  LORazepam (ATIVAN) injection 2 mg (has no administration in time range)  lactated ringers bolus 1,000 mL (0 mLs Intravenous Stopped 12/30/18 1713)  ondansetron (ZOFRAN) injection 4 mg (4 mg Intravenous Given 12/30/18 1528)  LORazepam (ATIVAN) injection 2 mg (2 mg Intravenous Given 12/30/18 1537)  thiamine (B-1) injection 100 mg (100 mg Intravenous Given 12/30/18 1728)  LORazepam (ATIVAN) injection 1 mg (1 mg Intravenous Given 12/30/18 1810)  chlordiazePOXIDE (LIBRIUM) capsule 50 mg (50 mg Oral Given 12/30/18 1811)     Initial Impression / Assessment and Plan / ED Course  I have reviewed the triage vital signs and the nursing notes.  Pertinent labs & imaging results that were available during my care of the patient were reviewed by me and considered in my medical decision making (see chart for details).     With history of alcohol abuse here for evaluation of vomiting. He is non-toxic appearing on evaluation. Labs demonstrate elevation in his transaminases. Nausea and vomiting have resolved after Ativan and Zofran in the emergency department. On record review he is had multiple episodes of elevated transaminases in the past. He does not overtake acetaminophen. Discussed with patient recommendation for imaging to further evaluate his abdomen and liver and patient declines. He has no abdominal pain. Patient met with social work regarding outpatient alcohol rehab resources. Plan to discharge home with outpatient follow-up with PCP regarding his transaminases as well as rehab. Return precautions discussed.  Final Clinical Impressions(s)  / ED Diagnoses   Final diagnoses:  Transaminitis  Alcohol abuse    ED Discharge Orders         Ordered    chlordiazePOXIDE (LIBRIUM) 25 MG capsule     12/30/18 1853    ondansetron (ZOFRAN ODT) 4 MG disintegrating tablet  Every 8 hours PRN     12/30/18 1853           Quintella Reichert, MD 12/30/18 [phone]

## 2018-12-30 NOTE — Progress Notes (Addendum)
CSW received a call from [facility] ED RN CM who states pt desires outpatient SA tx, specifically for alcohol.  CSW faxed outpatient resources to [facility] ED Secretary's Fax machine at ph: [phone] and called pt at [phone] to advise that while a selection was faxed, [facility] offers a 30 day outpatient IOP regardless of whether pt's insurance covers pt or not.  CSW also faxed over a list of local AA meetings in [place], [place].  CSW called pt's R to let her know that pt's faxed information is at the fax machine listed above.  Pt was appreciative and thanked the CSW.  6:47 PM Pt asked the CSW to fax over a list of Residential Tx facilities in [place], as well.    Resources faxed, RN aware.  CSW will continue to follow for D/C needs.  Dorothe Pea. Miana Politte, LCSW, LCAS, CSI Clinical Social Worker Ph: [phone]

## 2018-12-30 NOTE — ED Triage Notes (Signed)
Pt reports that he has been nauseous and vomiting since Sunday of this week. Pt reports 3 episodes of emesis in the last 24 hours denies any diarrhea. Pt denies any abdominal pain.

## 2018-12-30 NOTE — Progress Notes (Addendum)
ED CM spoke with the patient and his mother at the bedside. The patient would like to begin outpatient rehab for ETOH abuse. Christiane Ha CSW at [facility] notified and will contact the patient to discuss options. Rubie Maid RN CCM   Patient reports he does not have a PCP. ED CM instructed the patient to call the number on his [employer] card and request a list of in-network PCPs. CM also provided the patient with the telephone number for [facility]. Rubie Maid RN CCM

## 2019-01-20 NOTE — Progress Notes (Addendum)
Subjective:    Patient ID: Seth Chavez, male    DOB: 11/12/1976, 43 y.o.   MRN: [id number]  HPI:  Mr. Plachy is here to establish as a new pt.  He is a pleasant 43 year old male. PMH: GAD and alcoholism Age 45 he reports abuse of Alprazolam and DUI- alcohol related He reports heavy ETOH use since early 6s, continues now He estimates to drink 6 beers and 2-4 "airplane bottles" of liquor per night He reports using ETOH to treat severe anxiety He denies ETOH interfering with work responsibilities and states "overall my marriage is good, but the drinking is a problem". He reports that his wife is also a heavy drinker. He denies depression or thoughts of harming himself/others He denies tobacco/vape use He walks his dog 2-3 miles 2-3 times/week, denies any other regular exercise, however he reports being a runner in his 20/30s He reports 3 ED visits in last 12 months for DTs/Alcohol withdrawal He declined referral to Intensive Out-Pt Rehab, agreeable to Psychology referral    Patient Care Team    Relationship Specialty Notifications Start End  Patient, No Pcp Per PCP - [facility]  03/21/18     Patient Active Problem List   Diagnosis Date Noted  . Alcoholism (HCC) 01/25/2019  . Healthcare maintenance 01/25/2019  . GAD (generalized anxiety disorder) 01/25/2019  . Elevated LFTs 01/25/2019     Past Medical History:  Diagnosis Date  . Anxiety      Past Surgical History:  Procedure Laterality Date  . FOOT SURGERY    . MANDIBLE SURGERY       Family History  Problem Relation Age of Onset  . Depression Father   . Heart attack Father   . Alcohol abuse Brother      Social History   Substance and Sexual Activity  Drug Use Never     Social History   Substance and Sexual Activity  Alcohol Use Yes   Comment: daily drinker     Social History   Tobacco Use  Smoking Status Former Smoker  . Packs/day: 0.50  . Years: 13.00  . Pack years: 6.50  .  Types: Cigarettes  . Last attempt to quit: 12/02/1997  . Years since quitting: 21.1  Smokeless Tobacco Current User  . Types: Snuff     Outpatient Encounter Medications as of 01/25/2019  Medication Sig  . [DISCONTINUED] chlordiazePOXIDE (LIBRIUM) 25 MG capsule 50mg  PO TID x 1D, then 25-50mg  PO BID X 1D, then 25-50mg  PO QD X 1D  . [DISCONTINUED] ondansetron (ZOFRAN ODT) 4 MG disintegrating tablet Take 1 tablet (4 mg total) by mouth every 8 (eight) hours as needed for nausea or vomiting.  . [DISCONTINUED] promethazine (PHENERGAN) 25 MG tablet Take 1 tablet (25 mg total) by mouth every 6 (six) hours as needed for nausea or vomiting. (Patient not taking: Reported on 12/28/2018)   No facility-administered encounter medications on file as of 01/25/2019.     Allergies: Patient has no known allergies.  Body mass index is 21.46 kg/m.  Blood pressure 137/86, pulse 85, temperature 98.8 F (37.1 C), temperature source Oral, height 5' 8.5" (1.74 m), weight 143 lb 3.2 oz (65 kg), SpO2 100 %.     Review of Systems  Constitutional: Positive for fatigue. Negative for activity change, appetite change, chills, diaphoresis, fever and unexpected weight change.  HENT: Negative for congestion.   Eyes: Negative for visual disturbance.  Respiratory: Negative for cough, chest tightness, shortness of breath, wheezing  and stridor.   Cardiovascular: Negative for chest pain, palpitations and leg swelling.  Gastrointestinal: Negative for abdominal distention, abdominal pain, blood in stool, constipation, diarrhea, nausea and vomiting.  Endocrine: Negative for cold intolerance, heat intolerance, polydipsia, polyphagia and polyuria.  Genitourinary: Negative for difficulty urinating and flank pain.  Musculoskeletal: Negative for arthralgias, back pain, gait problem, joint swelling, myalgias, neck pain and neck stiffness.  Skin: Negative for color change, pallor, rash and wound.  Neurological: Negative for dizziness  and headaches.  Hematological: Does not bruise/bleed easily.  Psychiatric/Behavioral: Negative for agitation, behavioral problems, confusion, decreased concentration, dysphoric mood, hallucinations, self-injury, sleep disturbance and suicidal ideas. The patient is nervous/anxious. The patient is not hyperactive.        Objective:   Physical Exam Vitals signs and nursing note reviewed.  Constitutional:      General: He is not in acute distress.    Appearance: He is not ill-appearing, toxic-appearing or diaphoretic.  HENT:     Head: Normocephalic and atraumatic.  Cardiovascular:     Rate and Rhythm: Normal rate.     Pulses: Normal pulses.     Heart sounds: Normal heart sounds. No murmur. No friction rub. No gallop.   Pulmonary:     Effort: Pulmonary effort is normal. No respiratory distress.     Breath sounds: Normal breath sounds. No stridor. No wheezing, rhonchi or rales.  Chest:     Chest wall: No tenderness.  Skin:    Capillary Refill: Capillary refill takes less than 2 seconds.  Neurological:     Mental Status: He is alert.  Psychiatric:        Mood and Affect: Mood normal.        Behavior: Behavior normal.        Thought Content: Thought content normal.        Judgment: Judgment normal.       Assessment & Plan:   1. Healthcare maintenance   2. GAD (generalized anxiety disorder)   3. Alcoholism (HCC)   4. Elevated LFTs     GAD (generalized anxiety disorder) Increase regular exercise Benzodiazepaines not appropriate due to addiction hx Declined starting SSRI Referral to Psychology placed   Healthcare maintenance Increase regular exercise and follow Mediterranean Diet. Reduce alcohol use by 10% each week. Referral to psychology placed, someone will call you to make an appt. Please schedule fasting labs in 1-2 weeks and complete physical 2-3 months.  Alcoholism (HCC) First drink at age 17 Significant increase in ETOH use  In mid 65s Heavy drinking started in  early 9s and has continued since then, current age 74 Estimates to drink 6 beers and 2-4 "airplane bottles" of liquor per day 3 ED visits for DTs/Alcholol Withdrawal in last year Declined referral to Intensive Out-Pt Therapy Agreeable to Behavioral Health Referral Increase regular exercise and reduce daily ETOH use by 10% per week  Elevated LFTs Significantly Elevated LFTs at recent ED encounter Long discussion about damage of chronic alcohol abuse, and that he needs to start slowly reducing ETOH ingestion by 10% each week     FOLLOW-UP:  Return in about 3 months (around 04/25/2019) for CPE.

## 2019-01-25 ENCOUNTER — Ambulatory Visit (INDEPENDENT_AMBULATORY_CARE_PROVIDER_SITE_OTHER): Payer: Managed Care, Other (non HMO) | Admitting: Adult Health

## 2019-01-25 ENCOUNTER — Encounter: Payer: Self-pay | Admitting: Adult Health

## 2019-01-25 VITALS — BP 137/86 | HR 85 | Temp 98.8°F | Ht 68.5 in | Wt 143.2 lb

## 2019-01-25 DIAGNOSIS — Z0000 Encounter for general adult medical examination without abnormal findings: Secondary | ICD-10-CM | POA: Diagnosis not present

## 2019-01-25 DIAGNOSIS — F411 Generalized anxiety disorder: Secondary | ICD-10-CM | POA: Diagnosis not present

## 2019-01-25 DIAGNOSIS — F1010 Alcohol abuse, uncomplicated: Secondary | ICD-10-CM | POA: Insufficient documentation

## 2019-01-25 DIAGNOSIS — F1020 Alcohol dependence, uncomplicated: Secondary | ICD-10-CM | POA: Diagnosis not present

## 2019-01-25 DIAGNOSIS — R945 Abnormal results of liver function studies: Secondary | ICD-10-CM

## 2019-01-25 DIAGNOSIS — R7989 Other specified abnormal findings of blood chemistry: Secondary | ICD-10-CM

## 2019-01-25 NOTE — Assessment & Plan Note (Signed)
>>  ASSESSMENT AND PLAN FOR GENERALIZED ANXIETY DISORDER WRITTEN ON 01/25/2019  3:49 PM BY DANFORD, KATY D, NP  Increase regular exercise Benzodiazepaines not appropriate due to addiction hx Declined starting SSRI Referral to Psychology placed

## 2019-01-25 NOTE — Patient Instructions (Signed)
Generalized Anxiety Disorder, Adult Generalized anxiety disorder (GAD) is a mental health disorder. People with this condition constantly worry about everyday events. Unlike normal anxiety, worry related to GAD is not triggered by a specific event. These worries also do not fade or get better with time. GAD interferes with life functions, including relationships, work, and school. GAD can vary from mild to severe. People with severe GAD can have intense waves of anxiety with physical symptoms (panic attacks). What are the causes? The exact cause of GAD is not known. What increases the risk? This condition is more likely to develop in:  Women.  People who have a family history of anxiety disorders.  People who are very shy.  People who experience very stressful life events, such as the death of a loved one.  People who have a very stressful family environment. What are the signs or symptoms? People with GAD often worry excessively about many things in their lives, such as their health and family. They may also be overly concerned about:  Doing well at work.  Being on time.  Natural disasters.  Friendships. Physical symptoms of GAD include:  Fatigue.  Muscle tension or having muscle twitches.  Trembling or feeling shaky.  Being easily startled.  Feeling like your heart is pounding or racing.  Feeling out of breath or like you cannot take a deep breath.  Having trouble falling asleep or staying asleep.  Sweating.  Nausea, diarrhea, or irritable bowel syndrome (IBS).  Headaches.  Trouble concentrating or remembering facts.  Restlessness.  Irritability. How is this diagnosed? Your health care provider can diagnose GAD based on your symptoms and medical history. You will also have a physical exam. The health care provider will ask specific questions about your symptoms, including how severe they are, when they started, and if they come and go. Your health care  provider may ask you about your use of alcohol or drugs, including prescription medicines. Your health care provider may refer you to a mental health specialist for further evaluation. Your health care provider will do a thorough examination and may perform additional tests to rule out other possible causes of your symptoms. To be diagnosed with GAD, a person must have anxiety that:  Is out of his or her control.  Affects several different aspects of his or her life, such as work and relationships.  Causes distress that makes him or her unable to take part in normal activities.  Includes at least three physical symptoms of GAD, such as restlessness, fatigue, trouble concentrating, irritability, muscle tension, or sleep problems. Before your health care provider can confirm a diagnosis of GAD, these symptoms must be present more days than they are not, and they must last for six months or longer. How is this treated? The following therapies are usually used to treat GAD:  Medicine. Antidepressant medicine is usually prescribed for long-term daily control. Antianxiety medicines may be added in severe cases, especially when panic attacks occur.  Talk therapy (psychotherapy). Certain types of talk therapy can be helpful in treating GAD by providing support, education, and guidance. Options include: ? Cognitive behavioral therapy (CBT). People learn coping skills and techniques to ease their anxiety. They learn to identify unrealistic or negative thoughts and behaviors and to replace them with positive ones. ? Acceptance and commitment therapy (ACT). This treatment teaches people how to be mindful as a way to cope with unwanted thoughts and feelings. ? Biofeedback. This process trains you to manage your body's response (  physiological response) through breathing techniques and relaxation methods. You will work with a therapist while machines are used to monitor your physical symptoms.  Stress  management techniques. These include yoga, meditation, and exercise. A mental health specialist can help determine which treatment is best for you. Some people see improvement with one type of therapy. However, other people require a combination of therapies. Follow these instructions at home:  Take over-the-counter and prescription medicines only as told by your health care provider.  Try to maintain a normal routine.  Try to anticipate stressful situations and allow extra time to manage them.  Practice any stress management or self-calming techniques as taught by your health care provider.  Do not punish yourself for setbacks or for not making progress.  Try to recognize your accomplishments, even if they are small.  Keep all follow-up visits as told by your health care provider. This is important. Contact a health care provider if:  Your symptoms do not get better.  Your symptoms get worse.  You have signs of depression, such as: ? A persistently sad, cranky, or irritable mood. ? Loss of enjoyment in activities that used to bring you joy. ? Change in weight or eating. ? Changes in sleeping habits. ? Avoiding friends or family members. ? Loss of energy for normal tasks. ? Feelings of guilt or worthlessness. Get help right away if:  You have serious thoughts about hurting yourself or others. If you ever feel like you may hurt yourself or others, or have thoughts about taking your own life, get help right away. You can go to your nearest emergency department or call:  Your local emergency services (911 in the [place]  A suicide crisis helpline, such as the National Suicide Prevention Lifeline at (902)590-7939. This is open 24 hours a day. Summary  Generalized anxiety disorder (GAD) is a mental health disorder that involves worry that is not triggered by a specific event.  People with GAD often worry excessively about many things in their lives, such as their health and  family.  GAD may cause physical symptoms such as restlessness, trouble concentrating, sleep problems, frequent sweating, nausea, diarrhea, headaches, and trembling or muscle twitching.  A mental health specialist can help determine which treatment is best for you. Some people see improvement with one type of therapy. However, other people require a combination of therapies. This information is not intended to replace advice given to you by your health care provider. Make sure you discuss any questions you have with your health care provider. Document Released: 03/15/2013 Document Revised: 10/08/2016 Document Reviewed: 10/08/2016 Elsevier Interactive Patient Education  2019 [employer].   Mediterranean Diet A Mediterranean diet refers to food and lifestyle choices that are based on the traditions of countries located on the [employer]. This way of eating has been shown to help prevent certain conditions and improve outcomes for people who have chronic diseases, like kidney disease and heart disease. What are tips for following this plan? Lifestyle  Cook and eat meals together with your family, when possible.  Drink enough fluid to keep your urine clear or pale yellow.  Be physically active every day. This includes: ? Aerobic exercise like running or swimming. ? Leisure activities like gardening, walking, or housework.  Get 7-8 hours of sleep each night.  Reading food labels   Check the serving size of packaged foods. For foods such as rice and pasta, the serving size refers to the amount of cooked product, not dry.  Check the total fat in packaged foods. Avoid foods that have saturated fat or trans fats.  Check the ingredients list for added sugars, such as corn syrup. Shopping  At the grocery store, buy most of your food from the areas near the walls of the store. This includes: ? Fresh fruits and vegetables (produce). ? Grains, beans, nuts, and seeds. Some of these may  be available in unpackaged forms or large amounts (in bulk). ? Fresh seafood. ? Poultry and eggs. ? Low-fat dairy products.  Buy whole ingredients instead of prepackaged foods.  Buy fresh fruits and vegetables in-season from local farmers markets.  Buy frozen fruits and vegetables in resealable bags.  If you do not have access to quality fresh seafood, buy precooked frozen shrimp or canned fish, such as tuna, salmon, or sardines.  Buy small amounts of raw or cooked vegetables, salads, or olives from the deli or salad bar at your store.  Stock your pantry so you always have certain foods on hand, such as olive oil, canned tuna, canned tomatoes, rice, pasta, and beans. Cooking  Cook foods with extra-virgin olive oil instead of using butter or other vegetable oils.  Have meat as a side dish, and have vegetables or grains as your main dish. This means having meat in small portions or adding small amounts of meat to foods like pasta or stew.  Use beans or vegetables instead of meat in common dishes like chili or lasagna.  Experiment with different cooking methods. Try roasting or broiling vegetables instead of steaming or sauteing them.  Add frozen vegetables to soups, stews, pasta, or rice.  Add nuts or seeds for added healthy fat at each meal. You can add these to yogurt, salads, or vegetable dishes.  Marinate fish or vegetables using olive oil, lemon juice, garlic, and fresh herbs. Meal planning   Plan to eat 1 vegetarian meal one day each week. Try to work up to 2 vegetarian meals, if possible.  Eat seafood 2 or more times a week.  Have healthy snacks readily available, such as: ? Vegetable sticks with hummus. ? [place] yogurt. ? Fruit and nut trail mix.  Eat balanced meals throughout the week. This includes: ? Fruit: 2-3 servings a day ? Vegetables: 4-5 servings a day ? Low-fat dairy: 2 servings a day ? Fish, poultry, or lean meat: 1 serving a day ? Beans and legumes: 2  or more servings a week ? Nuts and seeds: 1-2 servings a day ? Whole grains: 6-8 servings a day ? Extra-virgin olive oil: 3-4 servings a day  Limit red meat and sweets to only a few servings a month What are my food choices?  Mediterranean diet ? Recommended ? Grains: Whole-grain pasta. Brown rice. Bulgar wheat. Polenta. Couscous. Whole-wheat bread. Orpah Cobb. ? Vegetables: Artichokes. Beets. Broccoli. Cabbage. Carrots. Eggplant. Green beans. Chard. Kale. Spinach. Onions. Leeks. Peas. Squash. Tomatoes. Peppers. Radishes. ? Fruits: Apples. Apricots. Avocado. Berries. Bananas. Cherries. Dates. Figs. Grapes. Lemons. Melon. Oranges. Peaches. Plums. Pomegranate. ? Meats and other protein foods: Beans. Almonds. Sunflower seeds. Pine nuts. Peanuts. Cod. Salmon. Scallops. Shrimp. Tuna. Tilapia. Clams. Oysters. Eggs. ? Dairy: Low-fat milk. Cheese. Greek yogurt. ? Beverages: Water. Red wine. Herbal tea. ? Fats and oils: Extra virgin olive oil. Avocado oil. Grape seed oil. ? Sweets and desserts: [place] yogurt with honey. Baked apples. Poached pears. Trail mix. ? Seasoning and other foods: Basil. Cilantro. Coriander. Cumin. Mint. Parsley. Sage. Rosemary. Tarragon. Garlic. Oregano. Thyme. Pepper. Balsalmic vinegar. Tahini. Hummus.  Tomato sauce. Olives. Mushrooms. ? Limit these ? Grains: Prepackaged pasta or rice dishes. Prepackaged cereal with added sugar. ? Vegetables: Deep fried potatoes (french fries). ? Fruits: Fruit canned in syrup. ? Meats and other protein foods: Beef. Pork. Lamb. Poultry with skin. Hot dogs. Tomasa Blase. ? Dairy: Ice cream. Sour cream. Whole milk. ? Beverages: Juice. Sugar-sweetened soft drinks. Beer. Liquor and spirits. ? Fats and oils: Butter. Canola oil. Vegetable oil. Beef fat (tallow). Lard. ? Sweets and desserts: Cookies. Cakes. Pies. Candy. ? Seasoning and other foods: Mayonnaise. Premade sauces and marinades. ? The items listed may not be a complete list. Talk with  your dietitian about what dietary choices are right for you. Summary  The Mediterranean diet includes both food and lifestyle choices.  Eat a variety of fresh fruits and vegetables, beans, nuts, seeds, and whole grains.  Limit the amount of red meat and sweets that you eat.  This information is not intended to replace advice given to you by your health care provider. Make sure you discuss any questions you have with your health care provider. Document Released: 07/11/2016 Document Revised: 08/13/2016 Document Reviewed: 07/11/2016 Elsevier Interactive Patient Education  2019 Elsevier Inc[employer]   Increase regular exercise and follow Mediterranean Diet. Reduce alcohol use by 10% each week. Referral to psychology placed, someone will call you to make an appt. Please schedule fasting labs in 1-2 weeks and complete physical 2-3 months. WELCOME TO THE PRACTICE!

## 2019-01-25 NOTE — Assessment & Plan Note (Signed)
First drink at age 43 Significant increase in ETOH use  In mid 3s Heavy drinking started in early 71s and has continued since then, current age 25 Estimates to drink 6 beers and 2-4 "airplane bottles" of liquor per day 3 ED visits for DTs/Alcholol Withdrawal in last year Declined referral to Intensive Out-Pt Therapy Agreeable to [facility] Referral Increase regular exercise and reduce daily ETOH use by 10% per week

## 2019-01-25 NOTE — Assessment & Plan Note (Signed)
Increase regular exercise and follow Mediterranean Diet. Reduce alcohol use by 10% each week. Referral to psychology placed, someone will call you to make an appt. Please schedule fasting labs in 1-2 weeks and complete physical 2-3 months.

## 2019-01-25 NOTE — Assessment & Plan Note (Signed)
Significantly Elevated LFTs at recent ED encounter Long discussion about damage of chronic alcohol abuse, and that he needs to start slowly reducing ETOH ingestion by 10% each week

## 2019-01-25 NOTE — Assessment & Plan Note (Signed)
Increase regular exercise Benzodiazepaines not appropriate due to addiction hx Declined starting SSRI Referral to Psychology placed

## 2019-02-09 ENCOUNTER — Other Ambulatory Visit: Payer: Managed Care, Other (non HMO)

## 2019-02-09 DIAGNOSIS — Z0000 Encounter for general adult medical examination without abnormal findings: Secondary | ICD-10-CM

## 2019-02-10 LAB — COMPREHENSIVE METABOLIC PANEL
ALK PHOS: 37 IU/L — AB (ref 39–117)
ALT: 91 IU/L — AB (ref 0–44)
AST: 120 IU/L — ABNORMAL HIGH (ref 0–40)
Albumin/Globulin Ratio: 1.6 (ref 1.2–2.2)
Albumin: 4.8 g/dL (ref 4.0–5.0)
BUN/Creatinine Ratio: 9 (ref 9–20)
BUN: 8 mg/dL (ref 6–24)
Bilirubin Total: 0.9 mg/dL (ref 0.0–1.2)
CO2: 25 mmol/L (ref 20–29)
Calcium: 9.9 mg/dL (ref 8.7–10.2)
Chloride: 97 mmol/L (ref 96–106)
Creatinine, Ser: 0.87 mg/dL (ref 0.76–1.27)
GFR calc Af Amer: 124 mL/min/{1.73_m2} (ref >59)
GFR calc non Af Amer: 107 mL/min/{1.73_m2} (ref >59)
Globulin, Total: 3 g/dL (ref 1.5–4.5)
Glucose: 83 mg/dL (ref 65–99)
Potassium: 5.1 mmol/L (ref 3.5–5.2)
Sodium: 140 mmol/L (ref 134–144)
Total Protein: 7.8 g/dL (ref 6.0–8.5)

## 2019-02-10 LAB — TSH: TSH: 2.12 u[IU]/mL (ref 0.450–4.500)

## 2019-02-10 LAB — CBC WITH DIFFERENTIAL/PLATELET
Basophils Absolute: 0 10*3/uL (ref 0.0–0.2)
Basos: 1 %
EOS (ABSOLUTE): 0 10*3/uL (ref 0.0–0.4)
Eos: 1 %
Hematocrit: 47.2 % (ref 37.5–51.0)
Hemoglobin: 16.1 g/dL (ref 13.0–17.7)
IMMATURE GRANS (ABS): 0 10*3/uL (ref 0.0–0.1)
Immature Granulocytes: 0 %
LYMPHS: 37 %
Lymphocytes Absolute: 1.1 10*3/uL (ref 0.7–3.1)
MCH: 33.2 pg — ABNORMAL HIGH (ref 26.6–33.0)
MCHC: 34.1 g/dL (ref 31.5–35.7)
MCV: 97 fL (ref 79–97)
Monocytes Absolute: 0.3 10*3/uL (ref 0.1–0.9)
Monocytes: 11 %
Neutrophils Absolute: 1.5 10*3/uL (ref 1.4–7.0)
Neutrophils: 50 %
Platelets: 214 10*3/uL (ref 150–450)
RBC: 4.85 x10E6/uL (ref 4.14–5.80)
RDW: 12.5 % (ref 11.6–15.4)
WBC: 3 10*3/uL — AB (ref 3.4–10.8)

## 2019-02-10 LAB — LIPID PANEL
CHOL/HDL RATIO: 1.9 ratio (ref 0.0–5.0)
Cholesterol, Total: 271 mg/dL — ABNORMAL HIGH (ref 100–199)
HDL: 145 mg/dL (ref >39)
LDL Calculated: 111 mg/dL — ABNORMAL HIGH (ref 0–99)
Triglycerides: 76 mg/dL (ref 0–149)
VLDL Cholesterol Cal: 15 mg/dL (ref 5–40)

## 2019-02-10 LAB — HEMOGLOBIN A1C
ESTIMATED AVERAGE GLUCOSE: 97 mg/dL
Hgb A1c MFr Bld: 5 % (ref 4.8–5.6)

## 2019-02-22 ENCOUNTER — Emergency Department (HOSPITAL_COMMUNITY)
Admission: EM | Admit: 2019-02-22 | Discharge: 2019-02-22 | Disposition: A | Discharge status: Home / Self Care | Payer: Managed Care, Other (non HMO) | Attending: Emergency Medicine | Admitting: Emergency Medicine

## 2019-02-22 ENCOUNTER — Telehealth: Payer: Self-pay

## 2019-02-22 ENCOUNTER — Other Ambulatory Visit: Payer: Self-pay

## 2019-02-22 ENCOUNTER — Encounter (HOSPITAL_COMMUNITY): Payer: Self-pay | Admitting: Emergency Medicine

## 2019-02-22 DIAGNOSIS — Z87891 Personal history of nicotine dependence: Secondary | ICD-10-CM | POA: Insufficient documentation

## 2019-02-22 DIAGNOSIS — F10930 Alcohol use, unspecified with withdrawal, uncomplicated: Secondary | ICD-10-CM

## 2019-02-22 DIAGNOSIS — F10230 Alcohol dependence with withdrawal, uncomplicated: Secondary | ICD-10-CM

## 2019-02-22 DIAGNOSIS — R112 Nausea with vomiting, unspecified: Secondary | ICD-10-CM

## 2019-02-22 DIAGNOSIS — F10239 Alcohol dependence with withdrawal, unspecified: Secondary | ICD-10-CM | POA: Diagnosis not present

## 2019-02-22 DIAGNOSIS — R109 Unspecified abdominal pain: Secondary | ICD-10-CM | POA: Diagnosis present

## 2019-02-22 LAB — COMPREHENSIVE METABOLIC PANEL
ALT: 106 U/L — ABNORMAL HIGH (ref 0–44)
AST: 125 U/L — ABNORMAL HIGH (ref 15–41)
Albumin: 4.6 g/dL (ref 3.5–5.0)
Alkaline Phosphatase: 40 U/L (ref 38–126)
Anion gap: 17 — ABNORMAL HIGH (ref 5–15)
BUN: 6 mg/dL (ref 6–20)
CO2: 28 mmol/L (ref 22–32)
Calcium: 9.5 mg/dL (ref 8.9–10.3)
Chloride: 93 mmol/L — ABNORMAL LOW (ref 98–111)
Creatinine, Ser: 0.99 mg/dL (ref 0.61–1.24)
GFR calc Af Amer: >60 mL/min (ref >60)
GFR calc non Af Amer: >60 mL/min (ref >60)
Glucose, Bld: 104 mg/dL — ABNORMAL HIGH (ref 70–99)
Potassium: 4.8 mmol/L (ref 3.5–5.1)
Sodium: 138 mmol/L (ref 135–145)
Total Bilirubin: 1.1 mg/dL (ref 0.3–1.2)
Total Protein: 8.1 g/dL (ref 6.5–8.1)

## 2019-02-22 LAB — CBC
HCT: 50.3 % (ref 39.0–52.0)
Hemoglobin: 17 g/dL (ref 13.0–17.0)
MCH: 32.6 pg (ref 26.0–34.0)
MCHC: 33.8 g/dL (ref 30.0–36.0)
MCV: 96.5 fL (ref 80.0–100.0)
Platelets: 197 10*3/uL (ref 150–400)
RBC: 5.21 MIL/uL (ref 4.22–5.81)
RDW: 11.8 % (ref 11.5–15.5)
WBC: 4.4 10*3/uL (ref 4.0–10.5)
nRBC: 0 % (ref 0.0–0.2)

## 2019-02-22 LAB — LIPASE, BLOOD: Lipase: 34 U/L (ref 11–51)

## 2019-02-22 MED ORDER — LORAZEPAM 1 MG PO TABS: 0.0000 mg | ORAL_TABLET | Freq: Two times a day (BID) | ORAL | Status: DC

## 2019-02-22 MED ORDER — LORAZEPAM 2 MG/ML IJ SOLN
0.5000 mg | Freq: Once | INTRAMUSCULAR | Status: AC
  Administered 2019-02-22: 0.5 mg via INTRAVENOUS
  Filled 2019-02-22: qty 1

## 2019-02-22 MED ORDER — THIAMINE HCL 100 MG/ML IJ SOLN: 100.0000 mg | Freq: Every day | INTRAMUSCULAR | Status: DC

## 2019-02-22 MED ORDER — LORAZEPAM 1 MG PO TABS
0.0000 mg | ORAL_TABLET | Freq: Four times a day (QID) | ORAL | Status: DC
  Administered 2019-02-22: 1 mg via ORAL
  Filled 2019-02-22: qty 1

## 2019-02-22 MED ORDER — LORAZEPAM 2 MG/ML IJ SOLN: 0.0000 mg | Freq: Two times a day (BID) | INTRAMUSCULAR | Status: DC

## 2019-02-22 MED ORDER — VITAMIN B-1 100 MG PO TABS
100.0000 mg | ORAL_TABLET | Freq: Every day | ORAL | Status: DC
  Administered 2019-02-22: 100 mg via ORAL
  Filled 2019-02-22 (×2): qty 1

## 2019-02-22 MED ORDER — ONDANSETRON 4 MG PO TBDP: 4.0000 mg | ORAL_TABLET | Freq: Three times a day (TID) | ORAL | 0 refills | Status: DC | PRN

## 2019-02-22 MED ORDER — SODIUM CHLORIDE 0.9% FLUSH: 3.0000 mL | Freq: Once | INTRAVENOUS | Status: DC

## 2019-02-22 MED ORDER — SODIUM CHLORIDE 0.9 % IV BOLUS
1000.0000 mL | Freq: Once | INTRAVENOUS | Status: AC
  Administered 2019-02-22: 1000 mL via INTRAVENOUS

## 2019-02-22 MED ORDER — CHLORDIAZEPOXIDE HCL 25 MG PO CAPS: ORAL_CAPSULE | ORAL | 0 refills | Status: DC

## 2019-02-22 MED ORDER — LORAZEPAM 2 MG/ML IJ SOLN: 0.0000 mg | Freq: Four times a day (QID) | INTRAMUSCULAR | Status: DC

## 2019-02-22 MED ORDER — ONDANSETRON HCL 4 MG/2ML IJ SOLN
4.0000 mg | Freq: Once | INTRAMUSCULAR | Status: AC
  Administered 2019-02-22: 4 mg via INTRAVENOUS
  Filled 2019-02-22: qty 2

## 2019-02-22 NOTE — ED Provider Notes (Signed)
[facility] EMERGENCY DEPARTMENT Provider Note   CSN: [id number] Arrival date & time: 02/22/19  1614    History   Chief Complaint Chief Complaint  Patient presents with  . Abdominal Pain    HPI Seth Chavez is a 43 y.o. male with a past medical history of alcoholism, transaminitis, generalized anxiety disorder, who presents today for evaluation of abdominal pain nausea and vomiting for 3 days.  He reports that he normally has about 10 drinks a day and he started trying to decrease his alcohol intake on Thursday.  He reports that the abdominal pain, nausea, and vomiting that he is having now feels consistent with alcohol withdrawal he has had in the past.  He denies any history of DTs or withdrawal seizures.  He says that he knows this is alcohol withdrawal and does not have concern for other cause.  He denies any fevers.  He denies hallucinations.  No fevers or diarrhea. He reports that he has been frequently having small amounts of alcohol to try and control the withdrawal symptoms.  He reports that he is actively seeking outpatient therapy and treatment and wants to stop drinking.     HPI  Past Medical History:  Diagnosis Date  . Anxiety     Patient Active Problem List   Diagnosis Date Noted  . Alcoholism (HCC) 01/25/2019  . Healthcare maintenance 01/25/2019  . GAD (generalized anxiety disorder) 01/25/2019  . Elevated LFTs 01/25/2019    Past Surgical History:  Procedure Laterality Date  . FOOT SURGERY    . MANDIBLE SURGERY          Home Medications    Prior to Admission medications   Medication Sig Start Date End Date Taking? Authorizing Provider  chlordiazePOXIDE (LIBRIUM) 25 MG capsule 50mg  PO TID x 1D, then 25-50mg  PO BID X 1D, then 25-50mg  PO QD X 1D 02/22/19   Cristina Gong, PA-C  ondansetron (ZOFRAN ODT) 4 MG disintegrating tablet Take 1 tablet (4 mg total) by mouth every 8 (eight) hours as needed for nausea or vomiting. 02/22/19    Cristina Gong, PA-C    Family History Family History  Problem Relation Age of Onset  . Depression Father   . Heart attack Father   . Alcohol abuse Brother     Social History Social History   Tobacco Use  . Smoking status: Former Smoker    Packs/day: 0.50    Years: 13.00    Pack years: 6.50    Types: Cigarettes    Last attempt to quit: 12/02/1997    Years since quitting: 21.2  . Smokeless tobacco: Current User    Types: Snuff  Substance Use Topics  . Alcohol use: Yes    Comment: daily drinker  . Drug use: Never     Allergies   Patient has no known allergies.   Review of Systems Review of Systems  Constitutional: Negative for chills and fever.  HENT: Negative for congestion.   Respiratory: Negative for shortness of breath.   Cardiovascular: Negative for chest pain.  Gastrointestinal: Positive for abdominal pain (Only when he is going to vomit), nausea and vomiting. Negative for constipation and diarrhea.  Musculoskeletal: Negative for back pain, myalgias and neck pain.  Neurological: Negative for weakness.  Psychiatric/Behavioral: Negative for confusion, self-injury and sleep disturbance.  All other systems reviewed and are negative.    Physical Exam Updated Vital Signs BP 121/87 (BP Location: Right Arm)   Pulse 97   Temp 98.6  F (37 C) (Oral)   Resp 18   SpO2 100%   Physical Exam Vitals signs and nursing note reviewed.  Constitutional:      Appearance: He is well-developed.  HENT:     Head: Normocephalic and atraumatic.     Mouth/Throat:     Mouth: Mucous membranes are moist.  Eyes:     Conjunctiva/sclera: Conjunctivae normal.  Neck:     Musculoskeletal: Neck supple.  Cardiovascular:     Rate and Rhythm: Regular rhythm. Tachycardia present.     Heart sounds: Normal heart sounds. No murmur.  Pulmonary:     Effort: Pulmonary effort is normal. No respiratory distress.     Breath sounds: Normal breath sounds.  Abdominal:     General:  Abdomen is flat. Bowel sounds are normal.     Palpations: Abdomen is soft.     Tenderness: There is no abdominal tenderness. There is no guarding or rebound.  Skin:    General: Skin is warm and dry.  Neurological:     General: No focal deficit present.     Mental Status: He is alert and oriented to person, place, and time.  Psychiatric:        Mood and Affect: Mood normal.        Behavior: Behavior normal.      ED Treatments / Results  Labs (all labs ordered are listed, but only abnormal results are displayed) Labs Reviewed  COMPREHENSIVE METABOLIC PANEL - Abnormal; Notable for the following components:      Result Value   Chloride 93 (*)    Glucose, Bld 104 (*)    AST 125 (*)    ALT 106 (*)    Anion gap 17 (*)    All other components within normal limits  LIPASE, BLOOD  CBC  URINALYSIS, ROUTINE W REFLEX MICROSCOPIC    EKG None  Radiology No results found.  Procedures Procedures (including critical care time)  Medications Ordered in ED Medications  sodium chloride flush (NS) 0.9 % injection 3 mL (3 mLs Intravenous Not Given 02/22/19 1812)  LORazepam (ATIVAN) injection 0-4 mg ( Intravenous See Alternative 02/22/19 1811)    Or  LORazepam (ATIVAN) tablet 0-4 mg (1 mg Oral Given 02/22/19 1811)  LORazepam (ATIVAN) injection 0-4 mg (has no administration in time range)    Or  LORazepam (ATIVAN) tablet 0-4 mg (has no administration in time range)  thiamine (VITAMIN B-1) tablet 100 mg (100 mg Oral Given 02/22/19 2016)    Or  thiamine (B-1) injection 100 mg ( Intravenous See Alternative 02/22/19 2016)  ondansetron (ZOFRAN) injection 4 mg (4 mg Intravenous Given 02/22/19 1812)  sodium chloride 0.9 % bolus 1,000 mL (0 mLs Intravenous Stopped 02/22/19 2009)  LORazepam (ATIVAN) injection 0.5 mg (0.5 mg Intravenous Given 02/22/19 2014)     Initial Impression / Assessment and Plan / ED Course  I have reviewed the triage vital signs and the nursing notes.  Pertinent labs &  imaging results that were available during my care of the patient were reviewed by me and considered in my medical decision making (see chart for details).  Clinical Course as of Feb 21 2257  Mon Feb 22, 2019  2004 Patient HR improved, in the 90s in room.  He had the ativan PO, says doesn't feel like its helping.  Will give 0.5mg  IV.  Don't want to give full dose as PO dose was one hour ago.    [EH]  2026 Patient reevaluated, he says that  he is feeling much better, the IV Ativan helped him much more than the p.o. Ativan and he is ready to go home at this time.   [EH]    Clinical Course User Index [EH] Cristina Gong, PA-C      Patient presents today for evaluation of abdominal pain, nausea, and vomiting.  His symptoms started after he stopped drinking alcohol in an attempt to wean himself off.  He says that this feels like how he normally feels when he is withdrawing from alcohol.  He was placed on CIWA protocol and was initially given 1 mg of Ativan p.o. in addition to thiamine.  He is given 1 L of IV fluids after which his heart rate improved and he was no longer tachycardic.  His nausea was treated with Zofran, after which he was able to p.o. challenge without additional vomiting.  On reevaluation he stated that he still felt poorly and felt like the Ativan had not started working yet.  He was given 0.5 mg IV after which he reported feeling better and ready to go home.  He is given a prescription for a Librium taper in addition to resources for outpatient therapy.     Return precautions were discussed with patient who states their understanding.  At the time of discharge patient denied any unaddressed complaints or concerns.  Patient is agreeable for discharge home.   Final Clinical Impressions(s) / ED Diagnoses   Final diagnoses:  Alcohol withdrawal syndrome without complication (HCC)  Non-intractable vomiting with nausea, unspecified vomiting type    ED Discharge Orders          Ordered    chlordiazePOXIDE (LIBRIUM) 25 MG capsule     02/22/19 2031    ondansetron (ZOFRAN ODT) 4 MG disintegrating tablet  Every 8 hours PRN     02/22/19 2031           Norman Clay 02/22/19 2321    Raeford Razor, MD 02/22/19 [phone]

## 2019-02-22 NOTE — ED Triage Notes (Signed)
Pt c/o abdominal pain with nausea/vomiting x 2 days. Denies diarrhea.
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
## 2019-02-22 NOTE — Discharge Instructions (Signed)
Today you received medications that may make you sleepy or impair your ability to make decisions.  For the next 24 hours please do not drive, operate heavy machinery, care for a small child with out another adult present, or perform any activities that may cause harm to you or someone else if you were to fall asleep or be impaired.   You are being prescribed a medication which may make you sleepy. Please follow up of listed precautions for at least 24 hours after taking one dose.  

## 2019-03-15 ENCOUNTER — Other Ambulatory Visit (INDEPENDENT_AMBULATORY_CARE_PROVIDER_SITE_OTHER): Payer: Managed Care, Other (non HMO)

## 2019-03-15 ENCOUNTER — Telehealth: Payer: Self-pay

## 2019-03-15 ENCOUNTER — Other Ambulatory Visit: Payer: Self-pay

## 2019-03-15 DIAGNOSIS — D72819 Decreased white blood cell count, unspecified: Secondary | ICD-10-CM

## 2019-03-15 NOTE — Telephone Encounter (Signed)
Called pt again and again received message that VM box is full and cannot accept messages. T. Ranika Mcniel, CMA 

## 2019-03-15 NOTE — Telephone Encounter (Signed)
Per William Hamburger, pt needs virtual OV to discuss ETOH use.  VM box cannot accept messages.  Will attempt to call pt again later today.  Tiajuana Amass, CMA

## 2019-03-16 NOTE — Telephone Encounter (Signed)
Called pt again and again received message that VM box is full and cannot accept messages. T. Nelson, CMA 

## 2019-03-17 LAB — CBC WITH DIFFERENTIAL/PLATELET
Basophils Absolute: 0 10*3/uL (ref 0.0–0.2)
Basos: 1 %
EOS (ABSOLUTE): 0 10*3/uL (ref 0.0–0.4)
Eos: 0 %
Hematocrit: 44.8 % (ref 37.5–51.0)
Hemoglobin: 15.9 g/dL (ref 13.0–17.7)
Immature Grans (Abs): 0 10*3/uL (ref 0.0–0.1)
Immature Granulocytes: 0 %
Lymphocytes Absolute: 1.2 10*3/uL (ref 0.7–3.1)
Lymphs: 32 %
MCH: 34 pg — ABNORMAL HIGH (ref 26.6–33.0)
MCHC: 35.5 g/dL (ref 31.5–35.7)
MCV: 96 fL (ref 79–97)
Monocytes Absolute: 0.5 10*3/uL (ref 0.1–0.9)
Monocytes: 12 %
Neutrophils Absolute: 2 10*3/uL (ref 1.4–7.0)
Neutrophils: 55 %
Platelets: 127 10*3/uL — ABNORMAL LOW (ref 150–450)
RBC: 4.67 x10E6/uL (ref 4.14–5.80)
RDW: 12.9 % (ref 11.6–15.4)
WBC: 3.7 10*3/uL (ref 3.4–10.8)

## 2019-03-17 NOTE — Telephone Encounter (Signed)
03/17/2019  Attempted to reach pt by phone, however VM box is full and cannot accept messages.  Letter mailed to pt.  Tiajuana Amass, CMA

## 2019-03-17 NOTE — Telephone Encounter (Signed)
Called pt again and again received message that VM box is full and cannot accept messages. T. Nelson, CMA 

## 2019-03-17 NOTE — Telephone Encounter (Signed)
Called pt again and again received message that VM box is full and cannot accept messages. Tiajuana Amass, CMA

## 2019-03-27 ENCOUNTER — Other Ambulatory Visit: Payer: Self-pay

## 2019-03-27 ENCOUNTER — Emergency Department (HOSPITAL_COMMUNITY)
Admission: EM | Admit: 2019-03-27 | Discharge: 2019-03-28 | Disposition: A | Discharge status: Home / Self Care | Payer: Managed Care, Other (non HMO) | Attending: Emergency Medicine | Admitting: Emergency Medicine

## 2019-03-27 DIAGNOSIS — R112 Nausea with vomiting, unspecified: Secondary | ICD-10-CM

## 2019-03-27 DIAGNOSIS — Z5320 Procedure and treatment not carried out because of patient's decision for unspecified reasons: Secondary | ICD-10-CM | POA: Diagnosis not present

## 2019-03-27 DIAGNOSIS — F1010 Alcohol abuse, uncomplicated: Secondary | ICD-10-CM

## 2019-03-27 DIAGNOSIS — F17220 Nicotine dependence, chewing tobacco, uncomplicated: Secondary | ICD-10-CM | POA: Insufficient documentation

## 2019-03-27 DIAGNOSIS — F10229 Alcohol dependence with intoxication, unspecified: Secondary | ICD-10-CM | POA: Insufficient documentation

## 2019-03-27 MED ORDER — PANTOPRAZOLE SODIUM 40 MG IV SOLR
40.0000 mg | Freq: Once | INTRAVENOUS | Status: AC
  Administered 2019-03-27: 40 mg via INTRAVENOUS
  Filled 2019-03-27: qty 40

## 2019-03-27 MED ORDER — SODIUM CHLORIDE 0.9 % IV BOLUS
1000.0000 mL | Freq: Once | INTRAVENOUS | Status: AC
  Administered 2019-03-27: 1000 mL via INTRAVENOUS

## 2019-03-27 MED ORDER — LORAZEPAM 2 MG/ML IJ SOLN
0.5000 mg | Freq: Once | INTRAMUSCULAR | Status: AC
  Administered 2019-03-27: 0.5 mg via INTRAVENOUS
  Filled 2019-03-27: qty 1

## 2019-03-27 NOTE — ED Triage Notes (Signed)
Pt arrived via EMS with a hx of alcoholism for 10 years. Usually 6 shots a day. Past 5 days has binged on liquor and beer with last drink at 1800 tonight. Reports not eating the last 5 days d/t N/V with etoh. Was supposed to go to rehab 2 datys ago in [place] but decided not to due to wanting to stay local. Seeking tx and clearance for rehab.

## 2019-03-27 NOTE — ED Notes (Signed)
Bed: BU38 Expected date:  Expected time:  Means of arrival:  Comments: EMS ETOH-binge x 5 days

## 2019-03-27 NOTE — ED Notes (Signed)
Pt resting in bed. Meds given per MAR. Pt in NAD.

## 2019-03-27 NOTE — ED Provider Notes (Signed)
[facility]-EMERGENCY DEPT Provider Note   CSN: [id number] Arrival date & time: 03/27/19  2230    History   Chief Complaint Chief Complaint  Patient presents with  . Alcohol Intoxication    HPI Seth Chavez is a 43 y.o. male.     43 y.o. male with a past medical history of alcoholism x 10 years, transaminitis, generalized anxiety disorder presents to the emergency department for nausea and vomiting over the past 2 days.  Triage note references onset of nausea and vomiting 5 days ago.  He believes that his nausea and vomiting is due to alcohol withdrawal, but last drink liquor and beer at 1800 tonight.  Last episode of vomiting was yesterday.  He is not experiencing any fever, abdominal pain.  Was supposed to go to a rehab 2 days ago in [place], but decided that he wanted to remain local.  Was given a taper of Librium 3 weeks ago which he took, but was not able to remain sober after completing this course of medicine.  The history is provided by the patient. No language interpreter was used.  Alcohol Intoxication     Past Medical History:  Diagnosis Date  . Anxiety     Patient Active Problem List   Diagnosis Date Noted  . Alcoholism (HCC) 01/25/2019  . Healthcare maintenance 01/25/2019  . GAD (generalized anxiety disorder) 01/25/2019  . Elevated LFTs 01/25/2019    Past Surgical History:  Procedure Laterality Date  . FOOT SURGERY    . MANDIBLE SURGERY          Home Medications    Prior to Admission medications   Medication Sig Start Date End Date Taking? Authorizing Provider  chlordiazePOXIDE (LIBRIUM) 25 MG capsule 50mg  PO TID x 1D, then 25-50mg  PO BID X 1D, then 25-50mg  PO QD X 1D 03/28/19   Antony MaduraHumes, Jamilyn Pigeon, PA-C  ondansetron (ZOFRAN ODT) 4 MG disintegrating tablet Take 1 tablet (4 mg total) by mouth every 8 (eight) hours as needed for nausea or vomiting. 03/28/19   Antony MaduraHumes, Jamie-Lee Galdamez, PA-C    Family History Family History  Problem Relation Age of  Onset  . Depression Father   . Heart attack Father   . Alcohol abuse Brother     Social History Social History   Tobacco Use  . Smoking status: Former Smoker    Packs/day: 0.50    Years: 13.00    Pack years: 6.50    Types: Cigarettes    Last attempt to quit: 12/02/1997    Years since quitting: 21.3  . Smokeless tobacco: Current User    Types: Snuff  Substance Use Topics  . Alcohol use: Yes    Comment: daily drinker  . Drug use: Never     Allergies   Patient has no known allergies.   Review of Systems Review of Systems Ten systems reviewed and are negative for acute change, except as noted in the HPI.    Physical Exam Updated Vital Signs BP (!) 132/91 (BP Location: Left Arm)   Pulse 91   Temp 98.7 F (37.1 C) (Oral)   Resp 19   Ht 5\' 10"  (1.778 m)   Wt 64.4 kg   SpO2 98%   BMI 20.37 kg/m   Physical Exam Vitals signs and nursing note reviewed.  Constitutional:      General: He is not in acute distress.    Appearance: He is well-developed. He is not diaphoretic.     Comments: Nontoxic appearing and in  NAD. Hiccups.   HENT:     Head: Normocephalic and atraumatic.  Eyes:     General: No scleral icterus.    Conjunctiva/sclera: Conjunctivae normal.  Neck:     Musculoskeletal: Normal range of motion.  Cardiovascular:     Rate and Rhythm: Normal rate and regular rhythm.     Pulses: Normal pulses.  Pulmonary:     Effort: Pulmonary effort is normal. No respiratory distress.     Comments: Respirations even and unlabored Abdominal:     Palpations: There is no mass.     Tenderness: There is no guarding.     Hernia: No hernia is present.     Comments: Soft, nondistended. No focal TTP.  Musculoskeletal: Normal range of motion.  Skin:    General: Skin is warm and dry.     Coloration: Skin is not pale.     Findings: No erythema or rash.  Neurological:     Mental Status: He is alert and oriented to person, place, and time.     Coordination: Coordination  normal.     Comments: GCS 15.  Speech is clear, goal oriented.  Patient moving all extremities spontaneously.  Psychiatric:        Behavior: Behavior normal.      ED Treatments / Results  Labs (all labs ordered are listed, but only abnormal results are displayed) Labs Reviewed - No data to display  EKG None  Radiology No results found.  Procedures Procedures (including critical care time)  Medications Ordered in ED Medications  sodium chloride 0.9 % bolus 1,000 mL (0 mLs Intravenous Stopped 03/28/19 0025)  pantoprazole (PROTONIX) injection 40 mg (40 mg Intravenous Given 03/27/19 2329)  LORazepam (ATIVAN) injection 0.5 mg (0.5 mg Intravenous Given 03/27/19 2328)  metoCLOPramide (REGLAN) injection 10 mg (10 mg Intravenous Given 03/28/19 0038)    12:55 AM  Tolerating PO water on recheck. States he is "feeling fine".  1:32 AM [place] substance database reviewed.  Patient last filled a Librium taper 1 month ago.  Will provide repeat prescription for this to try and assist with alcohol detox.  Resource guide provided with list of treatment facilities in the area.   Initial Impression / Assessment and Plan / ED Course  I have reviewed the triage vital signs and the nursing notes.  Pertinent labs & imaging results that were available during my care of the patient were reviewed by me and considered in my medical decision making (see chart for details).        Patient presents to the emergency department for complaints of nausea and vomiting over the past few days.  Last episode of emesis was yesterday.  Has a history of alcoholism x10 years.  Last drink at 1800 tonight.  Expresses concern for acute withdrawal; however, patient is not significantly tachycardic or hypertensive.  He is not tremulous.  He was given 0.5 mg IV Ativan for management of nausea given history of alcoholism.  Subsequently given Reglan for nausea and to help mitigate hiccups.  The patient has been hemodynamically  stable since arrival.  No complaints of abdominal pain.  Abdomen is soft and nontender on exam.  He is tolerating crackers and water without difficulty.  Plan for discharge on Librium taper.    1:37 AM Per RN, patient pulled IV out on his own and eloped from the department prior to formal discharge. He did not receive D/C papers. Was seen ambulating out to the ED in stable condition.   Final Clinical  Impressions(s) / ED Diagnoses   Final diagnoses:  Non-intractable vomiting with nausea, unspecified vomiting type  Alcohol abuse, continuous drinking behavior    ED Discharge Orders         Ordered    ondansetron (ZOFRAN ODT) 4 MG disintegrating tablet  Every 8 hours PRN     03/28/19 0131    chlordiazePOXIDE (LIBRIUM) 25 MG capsule     03/28/19 0131           Antony Madura, PA-C 03/28/19 1497    Gilda Crease, MD 04/02/19 [phone]

## 2019-03-28 MED ORDER — CHLORDIAZEPOXIDE HCL 25 MG PO CAPS: ORAL_CAPSULE | ORAL | 0 refills | Status: DC

## 2019-03-28 MED ORDER — METOCLOPRAMIDE HCL 5 MG/ML IJ SOLN
10.0000 mg | Freq: Once | INTRAMUSCULAR | Status: AC
  Administered 2019-03-28: 10 mg via INTRAVENOUS
  Filled 2019-03-28: qty 2

## 2019-03-28 MED ORDER — ONDANSETRON 4 MG PO TBDP: 4.0000 mg | ORAL_TABLET | Freq: Three times a day (TID) | ORAL | 0 refills | Status: DC | PRN

## 2019-03-28 NOTE — Discharge Instructions (Addendum)
You may use Librium as prescribed to try and discontinue drinking.  Do not drink alcohol while taking Librium.  You may use Zofran for management of nausea.  Follow-up with an outpatient detox/treatment facility.

## 2019-03-28 NOTE — ED Notes (Addendum)
Upon attempting to d/c pt, he ripped out his IV and left the department without d/c or follow up instructions. Security notified and pt escorted off of property.

## 2019-03-29 ENCOUNTER — Telehealth: Payer: Self-pay

## 2019-03-29 NOTE — Telephone Encounter (Signed)
Message  Received: Today  Message Contents  Danford, Jinny Blossom, NP  Stan Head, CMA        Good Morning Seth Chavez,  Can you please call Seth Chavez and inquire if he needs assistance with referral to local rehab program.  Per last ED notes he was to enter Rehab program in [place], however he prefers to remain local.  If he could schedule a telemedicine appt for [place] to discuss this that would be great.  We have been trying to call him for weeks and are just concerned about his overall health and well being.  Thanks! Seth Chavez      Attempted to call pt, however VM mailbox is full and cannot accept messages.  Will attempt to reach pt again.  Seth Chavez, CMA

## 2019-03-30 NOTE — Telephone Encounter (Signed)
Attempted to call pt, however VM mailbox is full and cannot accept messages.  Will attempt to reach pt again.  Tiajuana Amass, CMA

## 2019-04-01 NOTE — Telephone Encounter (Signed)
Again, attempted to call pt, however VM mailbox is full and cannot accept messages.  Tiajuana Amass, CMA

## 2019-04-02 DIAGNOSIS — F10930 Alcohol use, unspecified with withdrawal, uncomplicated: Secondary | ICD-10-CM

## 2019-04-02 DIAGNOSIS — R45851 Suicidal ideations: Secondary | ICD-10-CM | POA: Insufficient documentation

## 2019-04-02 DIAGNOSIS — F10230 Alcohol dependence with withdrawal, uncomplicated: Secondary | ICD-10-CM | POA: Insufficient documentation

## 2019-04-02 DIAGNOSIS — E441 Mild protein-calorie malnutrition: Secondary | ICD-10-CM | POA: Insufficient documentation

## 2019-04-02 DIAGNOSIS — R112 Nausea with vomiting, unspecified: Secondary | ICD-10-CM | POA: Insufficient documentation

## 2019-04-02 HISTORY — DX: Alcohol use, unspecified with withdrawal, uncomplicated: F10.930

## 2019-04-06 LAB — HEMOGLOBIN A1C: Hemoglobin A1C: 5.2

## 2019-04-06 LAB — HEPATIC FUNCTION PANEL
ALT: 166 — AB (ref 10–40)
AST: 175 — AB (ref 14–40)
Alkaline Phosphatase: 55 (ref 25–125)
Bilirubin, Total: 0.9

## 2019-04-06 LAB — BASIC METABOLIC PANEL
BUN: 15 (ref 4–21)
Creatinine: 1 (ref <=1.3)
Glucose: 112
Potassium: 4.1 (ref 3.4–5.3)
Sodium: 135 — AB (ref 137–147)

## 2019-04-06 LAB — CBC AND DIFFERENTIAL
HCT: 45 (ref 41–53)
Hemoglobin: 16 (ref 13.5–17.5)
Platelets: 127 — AB (ref 150–399)
WBC: 2.7

## 2019-04-06 LAB — VITAMIN B12: Vitamin B-12: 912

## 2019-04-06 LAB — TSH: TSH: 2.78 (ref <=5.90)

## 2019-04-08 LAB — LIPID PANEL
Cholesterol: 287 — AB (ref 0–200)
HDL: 78 — AB (ref 35–70)
LDL Cholesterol: 172
Triglycerides: 221 — AB (ref 40–160)

## 2019-04-08 LAB — GC/CHLAMYDIA PROBE AMP
Chlamydia Probe Amp: NEGATIVE
Gonococcus by NAA: NEGATIVE

## 2019-04-08 LAB — HM HIV SCREENING LAB: HM HIV Screening: NEGATIVE

## 2019-04-15 LAB — HEPATIC FUNCTION PANEL
ALT: 54 — AB (ref 10–40)
AST: 26 (ref 14–40)
Alkaline Phosphatase: 37 (ref 25–125)
Bilirubin, Total: 0.4

## 2019-04-15 LAB — CBC AND DIFFERENTIAL
HCT: 42 (ref 41–53)
Hemoglobin: 14.3 (ref 13.5–17.5)
Platelets: 463 — AB (ref 150–399)
WBC: 4.4

## 2019-05-31 ENCOUNTER — Other Ambulatory Visit: Payer: Self-pay

## 2019-05-31 ENCOUNTER — Encounter: Payer: Self-pay | Admitting: Adult Health

## 2019-05-31 ENCOUNTER — Ambulatory Visit (INDEPENDENT_AMBULATORY_CARE_PROVIDER_SITE_OTHER): Payer: Managed Care, Other (non HMO) | Admitting: Adult Health

## 2019-05-31 VITALS — BP 114/74 | HR 66 | Temp 98.4°F | Ht 68.5 in | Wt 153.5 lb

## 2019-05-31 DIAGNOSIS — R7989 Other specified abnormal findings of blood chemistry: Secondary | ICD-10-CM

## 2019-05-31 DIAGNOSIS — R945 Abnormal results of liver function studies: Secondary | ICD-10-CM

## 2019-05-31 DIAGNOSIS — F1020 Alcohol dependence, uncomplicated: Secondary | ICD-10-CM | POA: Diagnosis not present

## 2019-05-31 DIAGNOSIS — Z0000 Encounter for general adult medical examination without abnormal findings: Secondary | ICD-10-CM

## 2019-05-31 MED ORDER — TRAZODONE HCL 50 MG PO TABS: 50.0000 mg | ORAL_TABLET | Freq: Every evening | ORAL | 3 refills | Status: DC | PRN

## 2019-05-31 MED ORDER — PROPRANOLOL HCL 10 MG PO TABS: 10.0000 mg | ORAL_TABLET | Freq: Two times a day (BID) | ORAL | 1 refills | Status: DC

## 2019-05-31 MED ORDER — PANTOPRAZOLE SODIUM 40 MG PO TBEC: 40.0000 mg | DELAYED_RELEASE_TABLET | Freq: Every day | ORAL | 1 refills | Status: DC

## 2019-05-31 MED ORDER — NALTREXONE HCL 50 MG PO TABS: 50.0000 mg | ORAL_TABLET | Freq: Every day | ORAL | 1 refills | Status: DC

## 2019-05-31 NOTE — Assessment & Plan Note (Addendum)
Continue with your daily AA Meetings. Continue to abstain from alcohol. Continue all medications as directed, with one change- Reduce Propanolol from three times to two times daily. Remain well hydrated, follow heart healthy diet. Per "[facility] [place] [facility]" Naltrexone can be continued indefinitely, if tolerated.  He is on normal mx dose of 50mg  QHS Continue daily walking.

## 2019-05-31 NOTE — Assessment & Plan Note (Signed)
Remain off ETOH Denies Abd pain

## 2019-05-31 NOTE — Progress Notes (Signed)
Subjective:    Patient ID: Seth Chavez, male    DOB: 08/31/1976, 43 y.o.   MRN: [id number]  HPI: 01/25/2019 OV: Mr. Seth Chavez is here to establish as a new pt.  He is a pleasant 43 year old male. PMH: GAD and alcoholism Age 43 he reports abuse of Alprazolam and DUI- alcohol related He reports heavy ETOH use since early 2030s, continues now He estimates to drink 6 beers and 2-4 "airplane bottles" of liquor per night He reports using ETOH to treat severe anxiety He denies ETOH interfering with work responsibilities and states "overall my marriage is good, but the drinking is a problem". He reports that his wife is also a heavy drinker. He denies depression or thoughts of harming himself/others He denies tobacco/vape use He walks his dog 2-3 miles 2-3 times/week, denies any other regular exercise, however he reports being a runner in his 20/30s He reports 3 ED visits in last 12 months for DTs/Alcohol withdrawal He declined referral to Intensive Out-Pt Rehab, agreeable to Psychology referral   05/31/2019 OV: Mr. Seth Chavez is here for f/u after completing 20 day In Pt Rehab at [facility] Reviewed in patient notes, labs, treatment plan, and current medication regime. He denies any cravings since d/c He reports attending 1-3 AA meetings/day via ZOOM or in-person format. He reports excellent support from his family, friends, and his employer. He is back at work FT He reports medication compliance, denies SE He has only been taking Propanolol BID not TID- reports sig reduction in tremor and anxiety BP and HR excellent today 114/74, HR 66 He denies fatigue He reports excellent sleep He has been walking 2 miles/day He reports excellent appetite- he has gained 10 lbs since last OV in 01/2019   Patient Care Team    Relationship Specialty Notifications Start End  Julaine Fusianford, Cheyanna Strick D, NP PCP - General Family Medicine  02/09/19     Patient Active Problem List   Diagnosis Date Noted  .  Alcoholism (HCC) 01/25/2019  . Healthcare maintenance 01/25/2019  . GAD (generalized anxiety disorder) 01/25/2019  . Elevated LFTs 01/25/2019     Past Medical History:  Diagnosis Date  . Anxiety      Past Surgical History:  Procedure Laterality Date  . FOOT SURGERY    . MANDIBLE SURGERY       Family History  Problem Relation Age of Onset  . Depression Father   . Heart attack Father   . Alcohol abuse Brother      Social History   Substance and Sexual Activity  Drug Use Never     Social History   Substance and Sexual Activity  Alcohol Use Yes   Comment: daily drinker     Social History   Tobacco Use  Smoking Status Former Smoker  . Packs/day: 0.50  . Years: 13.00  . Pack years: 6.50  . Types: Cigarettes  . Quit date: 12/02/1997  . Years since quitting: 21.5  Smokeless Tobacco Current User  . Types: Snuff     Outpatient Encounter Medications as of 05/31/2019  Medication Sig  . naltrexone (DEPADE) 50 MG tablet Take 1 tablet (50 mg total) by mouth at bedtime.  . pantoprazole (PROTONIX) 40 MG tablet Take 1 tablet (40 mg total) by mouth daily.  . propranolol (INDERAL) 10 MG tablet Take 1 tablet (10 mg total) by mouth 2 (two) times daily.  . traZODone (DESYREL) 50 MG tablet Take 1 tablet (50 mg total) by mouth at bedtime as  needed.  . [DISCONTINUED] naltrexone (DEPADE) 50 MG tablet Take 1 tablet by mouth at bedtime.  . [DISCONTINUED] pantoprazole (PROTONIX) 40 MG tablet Take 1 tablet by mouth daily.  . [DISCONTINUED] propranolol (INDERAL) 10 MG tablet Take 1 tablet by mouth 2 (two) times daily.  . [DISCONTINUED] traZODone (DESYREL) 50 MG tablet Take 1 tablet by mouth at bedtime as needed.  . [DISCONTINUED] chlordiazePOXIDE (LIBRIUM) 25 MG capsule 50mg  PO TID x 1D, then 25-50mg  PO BID X 1D, then 25-50mg  PO QD X 1D  . [DISCONTINUED] ondansetron (ZOFRAN ODT) 4 MG disintegrating tablet Take 1 tablet (4 mg total) by mouth every 8 (eight) hours as needed for nausea  or vomiting.   No facility-administered encounter medications on file as of 05/31/2019.     Allergies: Patient has no known allergies.  Body mass index is 23 kg/m.  Blood pressure 114/74, pulse 66, temperature 98.4 F (36.9 C), temperature source Oral, height 5' 8.5" (1.74 m), weight 153 lb 8 oz (69.6 kg), SpO2 99 %.  Review of Systems  Constitutional: Negative for activity change, appetite change, chills, diaphoresis, fatigue, fever and unexpected weight change.  HENT: Negative for congestion.   Eyes: Negative for visual disturbance.  Respiratory: Negative for cough, chest tightness, shortness of breath, wheezing and stridor.   Cardiovascular: Negative for chest pain, palpitations and leg swelling.  Gastrointestinal: Negative for abdominal distention, anal bleeding, blood in stool, constipation, diarrhea, nausea and vomiting.  Endocrine: Negative for cold intolerance, heat intolerance, polydipsia, polyphagia and polyuria.  Genitourinary: Negative for difficulty urinating and flank pain.  Musculoskeletal: Negative for arthralgias, back pain, gait problem, joint swelling, myalgias, neck pain and neck stiffness.  Skin: Negative for color change, pallor, rash and wound.  Neurological: Negative for dizziness and headaches.  Hematological: Negative for adenopathy.  Psychiatric/Behavioral: Negative for agitation, behavioral problems, confusion, decreased concentration, dysphoric mood, hallucinations, self-injury, sleep disturbance and suicidal ideas. The patient is not nervous/anxious and is not hyperactive.        Objective:   Physical Exam Vitals signs and nursing note reviewed.  Constitutional:      General: He is not in acute distress.    Appearance: Normal appearance. He is normal weight. He is not ill-appearing, toxic-appearing or diaphoretic.  HENT:     Head: Normocephalic and atraumatic.  Eyes:     Extraocular Movements: Extraocular movements intact.     Conjunctiva/sclera:  Conjunctivae normal.     Pupils: Pupils are equal, round, and reactive to light.  Cardiovascular:     Pulses: Normal pulses.     Heart sounds: Normal heart sounds. No murmur. No friction rub. No gallop.   Pulmonary:     Effort: Pulmonary effort is normal. No respiratory distress.     Breath sounds: Normal breath sounds. No stridor. No wheezing, rhonchi or rales.  Chest:     Chest wall: No tenderness.  Skin:    Capillary Refill: Capillary refill takes less than 2 seconds.  Neurological:     Mental Status: He is alert.  Psychiatric:        Mood and Affect: Mood normal.        Behavior: Behavior normal.        Thought Content: Thought content normal.        Judgment: Judgment normal.       Assessment & Plan:   1. Elevated LFTs   2. Healthcare maintenance   3. Alcoholism ([place])     Alcoholism ([place]) Continue with your daily AA Meetings. Continue  to abstain from alcohol. Continue all medications as directed, with one change- Reduce Propanolol from three times to two times daily. Remain well hydrated, follow heart healthy diet. Per [facility]" Naltrexone can be continued indefinitely, if tolerated.  He is on normal mx dose of 50mg  QHS Continue daily walking.   Healthcare maintenance Continue to social distance and wear a mask when in public F/4 3 months- CPE, fasting labs the week prior.  Elevated LFTs Remain off ETOH Denies Abd pain    FOLLOW-UP:  Return in about 3 months (around 08/31/2019) for CPE, Fasting Labs.

## 2019-05-31 NOTE — Patient Instructions (Addendum)

## 2019-05-31 NOTE — Assessment & Plan Note (Signed)
Continue to social distance and wear a mask when in public F/4 3 months- CPE, fasting labs the week prior.

## 2019-10-25 ENCOUNTER — Other Ambulatory Visit: Payer: Self-pay | Admitting: Adult Health

## 2019-10-25 NOTE — Telephone Encounter (Signed)
Patient called request Rx refill of :    traZODone (DESYREL) 50 MG tablet [[id number]]   Order Details Dose: 50 mg Route: Oral Frequency: At bedtime PRN  Dispense Quantity: 30 tablet Refills: 3 Fills remaining: --        Sig: Take 1 tablet (50 mg total) by mouth at bedtime as needed.          Forwarding request to med asst that if approved send refill order to :  [place], [place] [phone] (Phone) [phone] (Fax)   --glh

## 2019-10-26 ENCOUNTER — Other Ambulatory Visit: Payer: Self-pay | Admitting: Adult Health

## 2019-10-26 NOTE — Telephone Encounter (Signed)
This medication was refused yesterday stating that was a duplicate.    Refused   Disp Refills Start End  traZODone (DESYREL) 50 MG tablet 30 tablet 3 10/26/2019   Sig - Route:  Take 1 tablet (50 mg total) by mouth at bedtime as needed. - Oral  Class:  Normal  DAW:  No  Reason for Refusal:  Change not appropriate  Reason for Refusal Comment:  duplicate  Refused By:  Esaw Grandchild, NP     However, in reviewing the chart, last RX was sent on 05/31/2019 for #30 with 3 refills.  Therefore, refill request timing is appropriate.  Please review.  Charyl Bigger, CMA

## 2019-12-21 ENCOUNTER — Other Ambulatory Visit: Payer: Self-pay | Admitting: Adult Health

## 2019-12-21 ENCOUNTER — Telehealth: Payer: Self-pay

## 2019-12-21 NOTE — Telephone Encounter (Signed)
Please call pt to schedule appt.  No further refills until pt is seen.  T. Alexius Hangartner, CMA  

## 2019-12-21 NOTE — Telephone Encounter (Signed)
Good Afternoon Seth Chavez, Can you please call pt and schedule OV Thanks! [name]

## 2020-01-24 ENCOUNTER — Other Ambulatory Visit: Payer: Self-pay | Admitting: Adult Health

## 2020-02-09 ENCOUNTER — Other Ambulatory Visit: Payer: Self-pay | Admitting: Family Medicine

## 2020-03-06 ENCOUNTER — Other Ambulatory Visit: Payer: Self-pay

## 2020-03-06 ENCOUNTER — Encounter: Payer: Self-pay | Admitting: Family Medicine

## 2020-03-06 ENCOUNTER — Ambulatory Visit (INDEPENDENT_AMBULATORY_CARE_PROVIDER_SITE_OTHER): Payer: Managed Care, Other (non HMO) | Admitting: Family Medicine

## 2020-03-06 VITALS — Temp 97.1°F | Ht 68.5 in | Wt 145.0 lb

## 2020-03-06 DIAGNOSIS — F411 Generalized anxiety disorder: Secondary | ICD-10-CM | POA: Diagnosis not present

## 2020-03-06 DIAGNOSIS — R7989 Other specified abnormal findings of blood chemistry: Secondary | ICD-10-CM

## 2020-03-06 DIAGNOSIS — F1020 Alcohol dependence, uncomplicated: Secondary | ICD-10-CM

## 2020-03-06 DIAGNOSIS — K219 Gastro-esophageal reflux disease without esophagitis: Secondary | ICD-10-CM | POA: Diagnosis not present

## 2020-03-06 MED ORDER — PANTOPRAZOLE SODIUM 20 MG PO TBEC: DELAYED_RELEASE_TABLET | ORAL | 0 refills | Status: DC

## 2020-03-06 NOTE — Progress Notes (Signed)
Telehealth office visit note for Seth Chavez, D.O- at [facility] Care at [facility]   I connected with current patient today and verified that I am speaking with the correct person   . Location of the patient: Home . Location of the provider: Office - This visit type was conducted due to national recommendations for restrictions regarding the COVID-19 Pandemic (e.g. social distancing) in an effort to limit this patient's exposure and mitigate transmission in our community.    - No physical exam could be performed with this format, beyond that communicated to [place] by the patient/ family members as noted.   - Additionally my office staff/ schedulers were to discuss with the patient that there may be a monetary charge related to this service, depending on their medical insurance.  My understanding is that patient understood and consented to proceed.     _________________________________________________________________________________   History of Present Illness:  I, Peggye Fothergill, am serving as scribe for [employer].  - Sobriety from Alcohol He continues to remain sober.  Notes that for treatment, he's entered AA, and has been working the program for 11 months today.  He's surrounded himself with a network of people in the program, has a sponsor in the program, and is going to AA meetings at least 2-3 times daily.  - GERD; Managed on 40 mg Protonix He was given the 40 mg dose of Protonix by the medical staff at [facility], and this is the prescription with which he was discharged.  States he's just continued to take it since he left.  During the time when he was actively drinking, notes "there was tons of stuff that I had going on, trouble swallowing, my digestive system and all that."  At [facility], they determined what prescription he needed to take, and so he has continued to take the Protonix as established.  Says "I don't know that I continue to take it as a  preventative thing" or whatnot.  Says he's noticed a couple of days where there was a little bit of irritation, "but it wasn't anything that lasted more than a day or so."  This irritation "was almost kind of like heartburn or indigestion in a way, but it wasn't anything major like where it used to have me double over or anything like that."  Back when he was drinking, confirms that he coughed up blood quite a bit.  - History of Elevated LFT's He has never tried milk thistle for liver support.  - Insomnia Has slowly weaned himself off of trazodone.  Says "I'm fine now" and able to get to sleep naturally, on his own.    Depression screen [facility] 2/9 03/06/2020 05/31/2019 01/25/2019  Decreased Interest 0 0 1  Down, Depressed, Hopeless 0 0 2  PHQ - 2 Score 0 0 3  Altered sleeping 0 0 0  Tired, decreased energy 0 0 1  Change in appetite 0 0 1  Feeling bad or failure about yourself  0 0 1  Trouble concentrating 0 0 1  Moving slowly or fidgety/restless 0 0 3  Suicidal thoughts 0 0 0  PHQ-9 Score 0 0 10  Difficult doing work/chores - - Somewhat difficult      Impression and Recommendations:     1. Elevated LFTs   2. Alcoholism (HCC)   3. GAD (generalized anxiety disorder)   4. Gastroesophageal reflux disease, unspecified whether esophagitis present      - Of note, this  is my first time meeting patient.  Patient is new to me and was previously being cared for at our office by William Hamburger, NP, who no longer works at primary care [employer].   Alcoholism - Stable at this time. - Patient remains sober.  - Encouraged patient to continue with treatment plan and continue to follow up with his support groups and AA meetings as established.  - Will continue to monitor.   Elevated LFT's - Stable at this time. - Encouraged patient to continue with alcohol sobriety.  - If desired, advised patient to begin milk thistle supplementation for support of liver health.  - In addition, to  help improve and preserve liver health, encouraged patient to engage in prudent exercise, drink adequate amounts of water, and avoid hepatotoxic substances.  - Will continue to monitor.   GAD - Stable at this time. - Continue management as established.  - Reviewed the "spokes of the wheel" of mood and health management.  Stressed the importance of ongoing prudent habits, including regular exercise, appropriate sleep hygiene, healthful dietary habits, and prayer/meditation to relax.  - Will continue to monitor.   GERD - Per patient, has been managed on 40 mg of Protonix ever since discharge from [facility].  - Reviewed with patient today that higher dosage of Protonix is typically used for people with history of complications such as esophageal varices, Barrett's esophagitis, ulcers, etc.  Discussed that with heavy drinking, the veins in the esophagus may have been more prone to bleeding, such as in the case of esophageal varices.  - Per patient, unsure what he was diagnosed with in the past aside from acid reflux.  - Recommended reducing dose of Protonix to 20 mg tablet daily, with an additional 20 mg only as needed.  Patient knows to attempt to reduce his dose to just 20 mg as often as possible.  - Will continue to monitor.  - As part of my medical decision making, I reviewed the following data within the electronic MEDICAL RECORD NUMBER History obtained from pt /family, CMA notes reviewed and incorporated if applicable, Labs reviewed, Radiograph/ tests reviewed if applicable and OV notes from prior OV's with me, as well as other specialists she/he has seen since seeing me last, were all reviewed and used in my medical decision making process today.    - Additionally, when appropriate, discussion had with patient regarding our treatment plan, and their biases/concerns about that plan were used in my medical decision making today.    - The patient agreed with the plan and demonstrated an  understanding of the instructions.   No barriers to understanding were identified.     - The patient was advised to call back or seek an in-person evaluation if the symptoms worsen or if the condition fails to improve as anticipated.   Return for CPE and full fasting lab work near future with Kandis Cocking as advised.    Meds ordered this encounter  Medications  . pantoprazole (PROTONIX) 20 MG tablet    Sig: TAKE 1 TABLET BY MOUTH TWICE DAILY.    Dispense:  180 tablet    Refill:  0    Do not send RF requests to Dr Sharee Holster in future as I will be leaving the practice April 30th 2021.  It will have to come from another provider at [facility] in future.    Medications Discontinued During This Encounter  Medication Reason  . naltrexone (DEPADE) 50 MG tablet   .  traZODone (DESYREL) 50 MG tablet No longer needed (for PRN medications)  . pantoprazole (PROTONIX) 40 MG tablet Reorder       Time spent on visit including pre-visit chart review and post-visit care was 16 minutes.    Note:  This note was prepared with assistance of Dragon voice recognition software. Occasional wrong-word or sound-a-like substitutions may have occurred due to the inherent limitations of voice recognition software.  The [place] was signed into law in 2016 which includes the topic of electronic health records.  This provides immediate access to information in MyChart.  This includes consultation notes, operative notes, office notes, lab results and pathology reports.  If you have any questions about what you read please let us know at your next visit or call us at the office.  We are right here with you.  This document serves as a record of services personally performed by Mellody Dance, DO. It was created on her behalf by Toni Amend, a trained medical scribe. The creation of this record is based on the scribe's personal observations and the provider's statements to them.    The  above documentation from Toni Amend, medical scribe, has been reviewed by Marjory Sneddon, D.O.    __________________________________________________________________________________     Patient Care Team    Relationship Specialty Notifications Start End  Esaw Grandchild, NP PCP - General Family Medicine  02/09/19      -Vitals obtained; medications/ allergies reconciled;  personal medical, social, Sx etc.histories were updated by CMA, reviewed by me and are reflected in chart   Patient Active Problem List   Diagnosis Date Noted  . Gastroesophageal reflux disease 03/06/2020  . Alcoholism ([place]) 01/25/2019  . Healthcare maintenance 01/25/2019  . GAD (generalized anxiety disorder) 01/25/2019  . Elevated LFTs 01/25/2019     Current Meds  Medication Sig  . Omega-3 Fatty Acids (FISH OIL) 1000 MG CAPS Take 3,000 mg by mouth daily.  . pantoprazole (PROTONIX) 20 MG tablet TAKE 1 TABLET BY MOUTH TWICE DAILY.  Marland Kitchen propranolol (INDERAL) 10 MG tablet Take 1 tablet (10 mg total) by mouth 2 (two) times daily.  . vitamin B-12 (CYANOCOBALAMIN) 1000 MCG tablet Take 1,000 mcg by mouth daily.  . [DISCONTINUED] pantoprazole (PROTONIX) 40 MG tablet TAKE 1 TABLET BY MOUTH DAILY. OFFICE VISIT REQUIRED PRIOR TO ANY FURTHER REFILLS     Allergies:  No Known Allergies   ROS:  See above HPI for pertinent positives and negatives   Objective:   Temperature (!) 97.1 F (36.2 C), temperature source Oral, height 5' 8.5" (1.74 m), weight 145 lb (65.8 kg).  (if some vitals are omitted, this means that patient was UNABLE to obtain them even though they were asked to get them prior to OV today.  They were asked to call us at their earliest convenience with these once obtained. ) General: A & O * 3; sounds in no acute distress; in usual state of health.  Skin: Pt confirms warm and dry extremities and pink fingertips HEENT: Pt confirms lips non-cyanotic Chest: Patient confirms normal chest  excursion and movement Respiratory: speaking in full sentences, no conversational dyspnea; patient confirms no use of accessory muscles Psych: insight appears good, mood- appears full

## 2020-03-07 ENCOUNTER — Ambulatory Visit: Payer: Managed Care, Other (non HMO) | Admitting: Family Medicine

## 2020-05-16 ENCOUNTER — Telehealth: Payer: Self-pay | Admitting: Physician Assistant

## 2020-05-16 DIAGNOSIS — K219 Gastro-esophageal reflux disease without esophagitis: Secondary | ICD-10-CM

## 2020-05-16 MED ORDER — PANTOPRAZOLE SODIUM 20 MG PO TBEC: DELAYED_RELEASE_TABLET | ORAL | 0 refills | Status: DC

## 2020-05-16 NOTE — Telephone Encounter (Signed)
Patient is requesting a refill of his propranolol, if approved please send to [facility]

## 2020-05-16 NOTE — Telephone Encounter (Signed)
Medication refill sent to pharmacy. AS, CMA ?

## 2020-05-16 NOTE — Addendum Note (Signed)
Addended by: Sylvester Harder on: 05/16/2020 10:33 AM   Modules accepted: Orders

## 2020-05-17 ENCOUNTER — Other Ambulatory Visit: Payer: Self-pay | Admitting: Adult Health

## 2020-05-18 ENCOUNTER — Telehealth: Payer: Self-pay | Admitting: Physician Assistant

## 2020-05-18 NOTE — Telephone Encounter (Signed)
Medication pantoprazole (PROTONIX) 20 MG tablet [[id number]] pantoprazole (PROTONIX) 20 MG tablet [[id number]]    Order Details Dose, Route, Frequency: As Directed  Dispense Quantity: 180 tablet Refills: 0   Note to Pharmacy: Do not send RF requests to Dr Sharee Holster in future as I will be leaving the practice April 30th 2021.  It will have to come from another provider at [facility] in future.  Indications of Use: Gastroesophageal Reflux Disease       Sig: TAKE 1 TABLET BY MOUTH TWICE DAILY.       Start Date: 05/16/20 End Date: --  Written Date: 05/16/20 Expiration Date: 05/16/21     Diagnosis Association: Gastroesophageal reflux disease, unspecified whether esophagitis present (K21.9)  Original Order:  pantoprazole (PROTONIX) 20 MG tablet [[id number]]  Providers  Ordering and Authorizing Provider: Peggye Fothergill NPI: [id number]   DEA #: [id number]  Ordering User:  Sylvester Harder, CMA      Pharmacy  [facility] - [place], [place] - [address]  [address] [place] [address]  Phone:  [phone]  Fax:  [phone]  DEA #:  --    This medication has already been sent to pharmacy. AS, CMA

## 2020-05-18 NOTE — Telephone Encounter (Signed)
Patient called states he needs a refill on :   propranolol (INDERAL) 10 MG tablet [[id number]]   Order Details Dose: 10 mg Route: Oral Frequency: 2 times daily  Dispense Quantity: 180 tablet Refills: 1       Sig: Take 1 tablet (10 mg total) by mouth 2 (two) times daily.       Pt uses :   [facility] - [place], [place] - [address] Phone:  [phone]  Fax:  [phone]     --Fausto Skillern

## 2020-05-19 ENCOUNTER — Other Ambulatory Visit: Payer: Self-pay | Admitting: Physician Assistant

## 2020-06-26 ENCOUNTER — Other Ambulatory Visit: Payer: Self-pay

## 2020-06-26 ENCOUNTER — Other Ambulatory Visit: Payer: Managed Care, Other (non HMO)

## 2020-06-26 DIAGNOSIS — F1020 Alcohol dependence, uncomplicated: Secondary | ICD-10-CM

## 2020-06-26 DIAGNOSIS — R7989 Other specified abnormal findings of blood chemistry: Secondary | ICD-10-CM

## 2020-06-26 DIAGNOSIS — Z0000 Encounter for general adult medical examination without abnormal findings: Secondary | ICD-10-CM

## 2020-06-27 LAB — CBC WITH DIFFERENTIAL/PLATELET
Basophils Absolute: 0 10*3/uL (ref 0.0–0.2)
Basos: 1 %
EOS (ABSOLUTE): 0.1 10*3/uL (ref 0.0–0.4)
Eos: 1 %
Hematocrit: 48 % (ref 37.5–51.0)
Hemoglobin: 15.7 g/dL (ref 13.0–17.7)
Immature Grans (Abs): 0 10*3/uL (ref 0.0–0.1)
Immature Granulocytes: 0 %
Lymphocytes Absolute: 1.9 10*3/uL (ref 0.7–3.1)
Lymphs: 40 %
MCH: 29.5 pg (ref 26.6–33.0)
MCHC: 32.7 g/dL (ref 31.5–35.7)
MCV: 90 fL (ref 79–97)
Monocytes Absolute: 0.4 10*3/uL (ref 0.1–0.9)
Monocytes: 9 %
Neutrophils Absolute: 2.4 10*3/uL (ref 1.4–7.0)
Neutrophils: 49 %
Platelets: 243 10*3/uL (ref 150–450)
RBC: 5.32 x10E6/uL (ref 4.14–5.80)
RDW: 12.2 % (ref 11.6–15.4)
WBC: 4.8 10*3/uL (ref 3.4–10.8)

## 2020-06-27 LAB — LIPID PANEL
Chol/HDL Ratio: 8.3 ratio — ABNORMAL HIGH (ref 0.0–5.0)
Cholesterol, Total: 273 mg/dL — ABNORMAL HIGH (ref 100–199)
HDL: 33 mg/dL — ABNORMAL LOW (ref >39)
LDL Chol Calc (NIH): 202 mg/dL — ABNORMAL HIGH (ref 0–99)
Triglycerides: 198 mg/dL — ABNORMAL HIGH (ref 0–149)
VLDL Cholesterol Cal: 38 mg/dL (ref 5–40)

## 2020-06-27 LAB — TSH: TSH: 2.35 u[IU]/mL (ref 0.450–4.500)

## 2020-06-27 LAB — COMPREHENSIVE METABOLIC PANEL
ALT: 14 IU/L (ref 0–44)
AST: 12 IU/L (ref 0–40)
Albumin/Globulin Ratio: 1.9 (ref 1.2–2.2)
Albumin: 4.7 g/dL (ref 4.0–5.0)
Alkaline Phosphatase: 40 IU/L — ABNORMAL LOW (ref 48–121)
BUN/Creatinine Ratio: 22 — ABNORMAL HIGH (ref 9–20)
BUN: 21 mg/dL (ref 6–24)
Bilirubin Total: 0.6 mg/dL (ref 0.0–1.2)
CO2: 26 mmol/L (ref 20–29)
Calcium: 9.7 mg/dL (ref 8.7–10.2)
Chloride: 102 mmol/L (ref 96–106)
Creatinine, Ser: 0.94 mg/dL (ref 0.76–1.27)
GFR calc Af Amer: 115 mL/min/{1.73_m2} (ref >59)
GFR calc non Af Amer: 100 mL/min/{1.73_m2} (ref >59)
Globulin, Total: 2.5 g/dL (ref 1.5–4.5)
Glucose: 97 mg/dL (ref 65–99)
Potassium: 4.4 mmol/L (ref 3.5–5.2)
Sodium: 142 mmol/L (ref 134–144)
Total Protein: 7.2 g/dL (ref 6.0–8.5)

## 2020-06-27 LAB — HEMOGLOBIN A1C
Est. average glucose Bld gHb Est-mCnc: 105 mg/dL
Hgb A1c MFr Bld: 5.3 % (ref 4.8–5.6)

## 2020-07-19 ENCOUNTER — Encounter: Payer: Self-pay | Admitting: Physician Assistant

## 2020-07-19 ENCOUNTER — Ambulatory Visit (INDEPENDENT_AMBULATORY_CARE_PROVIDER_SITE_OTHER): Payer: Managed Care, Other (non HMO) | Admitting: Physician Assistant

## 2020-07-19 ENCOUNTER — Other Ambulatory Visit: Payer: Self-pay

## 2020-07-19 VITALS — BP 120/80 | HR 67 | Temp 97.8°F | Ht 68.5 in | Wt 143.3 lb

## 2020-07-19 DIAGNOSIS — F411 Generalized anxiety disorder: Secondary | ICD-10-CM

## 2020-07-19 DIAGNOSIS — H6121 Impacted cerumen, right ear: Secondary | ICD-10-CM | POA: Diagnosis not present

## 2020-07-19 DIAGNOSIS — Z0000 Encounter for general adult medical examination without abnormal findings: Secondary | ICD-10-CM

## 2020-07-19 MED ORDER — PROPRANOLOL HCL 10 MG PO TABS: 10.0000 mg | ORAL_TABLET | Freq: Two times a day (BID) | ORAL | 0 refills | Status: DC

## 2020-07-19 NOTE — Patient Instructions (Signed)
Preventive Care 40-43 Years Old, Male °Preventive care refers to lifestyle choices and visits with your health care provider that can promote health and wellness. This includes: °· A yearly physical exam. This is also called an annual well check. °· Regular dental and eye exams. °· Immunizations. °· Screening for certain conditions. °· Healthy lifestyle choices, such as eating a healthy diet, getting regular exercise, not using drugs or products that contain nicotine and tobacco, and limiting alcohol use. °What can I expect for my preventive care visit? °Physical exam °Your health care provider will check: °· Height and weight. These may be used to calculate body mass index (BMI), which is a measurement that tells if you are at a healthy weight. °· Heart rate and blood pressure. °· Your skin for abnormal spots. °Counseling °Your health care provider may ask you questions about: °· Alcohol, tobacco, and drug use. °· Emotional well-being. °· Home and relationship well-being. °· Sexual activity. °· Eating habits. °· Work and work environment. °What immunizations do I need? ° °Influenza (flu) vaccine °· This is recommended every year. °Tetanus, diphtheria, and pertussis (Tdap) vaccine °· You may need a Td booster every 10 years. °Varicella (chickenpox) vaccine °· You may need this vaccine if you have not already been vaccinated. °Zoster (shingles) vaccine °· You may need this after age 60. °Measles, mumps, and rubella (MMR) vaccine °· You may need at least one dose of MMR if you were born in 1957 or later. You may also need a second dose. °Pneumococcal conjugate (PCV13) vaccine °· You may need this if you have certain conditions and were not previously vaccinated. °Pneumococcal polysaccharide (PPSV23) vaccine °· You may need one or two doses if you smoke cigarettes or if you have certain conditions. °Meningococcal conjugate (MenACWY) vaccine °· You may need this if you have certain conditions. °Hepatitis A  vaccine °· You may need this if you have certain conditions or if you travel or work in places where you may be exposed to hepatitis A. °Hepatitis B vaccine °· You may need this if you have certain conditions or if you travel or work in places where you may be exposed to hepatitis B. °Haemophilus influenzae type b (Hib) vaccine °· You may need this if you have certain risk factors. °Human papillomavirus (HPV) vaccine °· If recommended by your health care provider, you may need three doses over 6 months. °You may receive vaccines as individual doses or as more than one vaccine together in one shot (combination vaccines). Talk with your health care provider about the risks and benefits of combination vaccines. °What tests do I need? °Blood tests °· Lipid and cholesterol levels. These may be checked every 5 years, or more frequently if you are over 50 years old. °· Hepatitis C test. °· Hepatitis B test. °Screening °· Lung cancer screening. You may have this screening every year starting at age 55 if you have a 30-pack-year history of smoking and currently smoke or have quit within the past 15 years. °· Prostate cancer screening. Recommendations will vary depending on your family history and other risks. °· Colorectal cancer screening. All adults should have this screening starting at age 50 and continuing until age 75. Your health care provider may recommend screening at age 45 if you are at increased risk. You will have tests every 1-10 years, depending on your results and the type of screening test. °· Diabetes screening. This is done by checking your blood sugar (glucose) after you have not eaten   for a while (fasting). You may have this done every 1-3 years.  Sexually transmitted disease (STD) testing. Follow these instructions at home: Eating and drinking  Eat a diet that includes fresh fruits and vegetables, whole grains, lean protein, and low-fat dairy products.  Take vitamin and mineral supplements as  recommended by your health care provider.  Do not drink alcohol if your health care provider tells you not to drink.  If you drink alcohol: ? Limit how much you have to 0-2 drinks a day. ? Be aware of how much alcohol is in your drink. In the [place] one drink equals one 12 oz bottle of beer (355 mL), one 5 oz glass of wine (148 mL), or one 1 oz glass of hard liquor (44 mL). Lifestyle  Take daily care of your teeth and gums.  Stay active. Exercise for at least 30 minutes on 5 or more days each week.  Do not use any products that contain nicotine or tobacco, such as cigarettes, e-cigarettes, and chewing tobacco. If you need help quitting, ask your health care provider.  If you are sexually active, practice safe sex. Use a condom or other form of protection to prevent STIs (sexually transmitted infections).  Talk with your health care provider about taking a low-dose aspirin every day starting at age 50. What's next?  Go to your health care provider once a year for a well check visit.  Ask your health care provider how often you should have your eyes and teeth checked.  Stay up to date on all vaccines. This information is not intended to replace advice given to you by your health care provider. Make sure you discuss any questions you have with your health care provider. Document Revised: 11/12/2018 Document Reviewed: 11/12/2018 Elsevier Patient Education  2020 [employer]  

## 2020-07-19 NOTE — Progress Notes (Signed)
Male physical   Impression and Recommendations:    1. Healthcare maintenance   2. GAD (generalized anxiety disorder)   3. Impacted cerumen of right ear      1) Anticipatory Guidance: Discussed skin CA prevention and sunscreen when outside along with skin surveillance; eating a balanced and modest diet; physical activity at least 25 minutes per day or minimum of 150 min/ week moderate to intense activity.  2) Immunizations / Screenings / Labs:   All immunizations are up-to-date per recommendations or will be updated today if pt allows.    - Patient understands with dental and vision screens they will schedule independently.  - Obtained CBC, CMP, HgA1c, Lipid panel, and TSH when fasting. Most labs were essentially within normal limits or stable from prior.  Lipid panel elevated. The 10-year ASCVD risk score Denman George DC Jr., et al., 2013) is: 4% -UTD on hep C and HIV screenings. -Pt can't remember last tetanus vaccine. Advised if more than 10 years recommend to update.  3) Weight:  BMI meaning discussed with patient.  Discussed goal to improve diet habits to improve overall feelings of well being and objective health data. Improve nutrient density of diet through increasing intake of fruits and vegetables and decreasing saturated fats, white flour products and refined sugars.   4) Healthcare Maintenance: -Continue current medication regimen. -Follow heart healthy diet and increase physical activity level. -Stay well hydrated. -Use OTC Debrox for earwax removal -Follow up in 4 months for reg OV: HLD, Mood and recheck lipid panel/cmp   No orders of the defined types were placed in this encounter.   Meds ordered this encounter  Medications   propranolol (INDERAL) 10 MG tablet    Sig: Take 1 tablet (10 mg total) by mouth 2 (two) times daily.    Dispense:  180 tablet    Refill:  0    Order Specific Question:   Supervising Provider    Answer:   Seth Chavez [[id number]]      Return in about 4 months (around 11/18/2020) for HLD, Mood, and FBW (lipid panel, cmp) few days prior to OV.   Reminded pt important of f-up preventative CPE in 1 year, this is in addition to any chronic care visits.    Gross side effects, risk and benefits, and alternatives of medications discussed with patient.  Patient is aware that all medications have potential side effects and we are unable to predict every side effect or drug-drug interaction that may occur.  Expresses verbal understanding and consents to current therapy plan and treatment regimen.  Please see AVS handed out to patient at the end of our visit for further patient instructions/ counseling done pertaining to today's office visit.  Note:  This note was prepared with assistance of Dragon voice recognition software. Occasional wrong-word or sound-a-like substitutions may have occurred due to the inherent limitations of voice recognition software.    Subjective:       CC: CPE   HPI: Seth Chavez is a 44 y.o. male who presents to [facility] at [facility] today for a yearly health maintenance exam.     Health Maintenance Summary  - Reviewed and updated, unless pt declines services.  Last Cologuard or Colonoscopy: N/A,  Family history of Colon CA: No  Tobacco History Reviewed: Yes, former smoker Abdominal Ultrasound:   N/A CT scan for screening lung CA:  N/A Alcohol / drug use:    No concerns, sober for 15  months / no use Dental Home:  Y Eye exams: Y Dermatology home: N  Male history: STD concerns:   none Additional penile/ urinary concerns: none   Additional concerns beyond Health Maintenance issues:  none     There is no immunization history on file for this patient.   Health Maintenance  Topic Date Due   COVID-19 Vaccine (1) Never done   TETANUS/TDAP  Never done   INFLUENZA VACCINE  07/02/2020   Hepatitis C Screening  Completed   HIV Screening  Completed        Wt Readings from Last 3 Encounters:  07/19/20 143 lb 4.8 oz (65 kg)  03/06/20 145 lb (65.8 kg)  05/31/19 153 lb 8 oz (69.6 kg)   BP Readings from Last 3 Encounters:  07/19/20 120/80  05/31/19 114/74  03/28/19 (!) 132/91   Pulse Readings from Last 3 Encounters:  07/19/20 67  05/31/19 66  03/28/19 91    Patient Active Problem List   Diagnosis Date Noted   Gastroesophageal reflux disease 03/06/2020   Alcohol withdrawal syndrome without complication (HCC) 04/02/2019   Mild protein-calorie malnutrition (HCC) 04/02/2019   Non-intractable vomiting with nausea 04/02/2019   Suicidal ideation 04/02/2019   Alcohol abuse 01/25/2019   Healthcare maintenance 01/25/2019   Generalized anxiety disorder 01/25/2019   Elevated LFTs 01/25/2019    Past Medical History:  Diagnosis Date   Anxiety     Past Surgical History:  Procedure Laterality Date   FOOT SURGERY     MANDIBLE SURGERY      Family History  Problem Relation Age of Onset   Depression Father    Heart attack Father    Alcohol abuse Brother     Social History   Substance and Sexual Activity  Drug Use Never  ,  Social History   Substance and Sexual Activity  Alcohol Use Yes   Comment: daily drinker  ,  Social History   Tobacco Use  Smoking Status Former Smoker   Packs/day: 0.50   Years: 13.00   Pack years: 6.50   Types: Cigarettes   Quit date: 12/02/1997   Years since quitting: 22.6  Smokeless Tobacco Current User   Types: Snuff  ,  Social History   Substance and Sexual Activity  Sexual Activity Yes   Birth control/protection: None    Patient's Medications  New Prescriptions   No medications on file  Previous Medications   OMEGA-3 FATTY ACIDS (FISH OIL) 1000 MG CAPS    Take 3,000 mg by mouth daily.   PANTOPRAZOLE (PROTONIX) 20 MG TABLET    TAKE 1 TABLET BY MOUTH TWICE DAILY.   VITAMIN B-12 (CYANOCOBALAMIN) 1000 MCG TABLET    Take 1,000 mcg by mouth daily.  Modified  Medications   Modified Medication Previous Medication   PROPRANOLOL (INDERAL) 10 MG TABLET propranolol (INDERAL) 10 MG tablet      Take 1 tablet (10 mg total) by mouth 2 (two) times daily.    Take 1 tablet (10 mg total) by mouth 2 (two) times daily. **PATIENT NEEDS APT FOR FURTHER REFILLS**  Discontinued Medications   No medications on file    Patient has no known allergies.  Review of Systems: General:   Denies fever, chills, unexplained weight loss.  Optho/Auditory:   Denies visual changes, blurred vision/LOV Respiratory:   Denies SOB, DOE more than baseline levels.   Cardiovascular:   Denies chest pain, palpitations, new onset peripheral edema  Gastrointestinal:   Denies nausea, vomiting, diarrhea.  Genitourinary: Denies  dysuria, freq/ urgency, flank pain  Endocrine:     Denies hot or cold intolerance, polyuria, polydipsia. Musculoskeletal:   Denies unexplained myalgias, joint swelling, unexplained arthralgias, gait problems.  Skin:  Denies rash, suspicious lesions Neurological:     Denies dizziness, unexplained weakness, numbness  Psychiatric/Behavioral:   Denies mood changes, suicidal or homicidal ideations, hallucinations    Objective:     Blood pressure 120/80, pulse 67, temperature 97.8 F (36.6 C), temperature source Oral, height 5' 8.5" (1.74 m), weight 143 lb 4.8 oz (65 kg), SpO2 100 %. Body mass index is 21.47 kg/m. General Appearance:    Alert, cooperative, no distress, appears stated age  Head:    Normocephalic, without obvious abnormality, atraumatic  Eyes:    PERRL, conjunctiva/corneas clear, EOM's intact, both eyes  Ears:    Normal TM's and external ear canals, left ear; cerumen impaction of right ear  Nose:   Nares normal, septum midline, mucosa normal, no drainage    or sinus tenderness  Throat:   Lips w/o lesion, mucosa moist, and tongue normal; teeth and gums normal  Neck:   Supple, symmetrical, trachea midline, no adenopathy;    thyroid:  no  enlargement/tenderness/nodules; no carotid   bruit or JVD  Back:     Symmetric, no curvature, ROM normal, no CVA tenderness  Lungs:     Clear to auscultation bilaterally, respirations unlabored, no Wh/ R/ R  Chest Wall:    No tenderness or gross deformity; normal excursion   Heart:    Regular rate and rhythm, S1 and S2 normal, no murmur, rub   or gallop  Abdomen:     Soft, non-tender, bowel sounds active all four quadrants, No G/R/R, no masses, no organomegaly  Genitalia:   Declined/deferred; no concerns/sxs  Rectal:    Declined/deferred; no concerns/sxs  Extremities:   Extremities normal, atraumatic, no cyanosis or gross edema  Pulses:   Normal  Skin:   Warm, dry, Skin color, texture, turgor normal, no obvious rashes or lesions  M-Sk:   Ambulates * 4 w/o difficulty, no gross deformities, tone WNL  Neurologic:   CNII-XII grossly intact, normal strength, sensation and reflexes    Throughout Psych:  No HI/SI, judgement and insight good, Euthymic mood. Full Affect.

## 2020-11-06 ENCOUNTER — Ambulatory Visit: Payer: Managed Care, Other (non HMO) | Admitting: Physician Assistant

## 2021-02-01 ENCOUNTER — Telehealth: Payer: Self-pay | Admitting: Physician Assistant

## 2021-02-01 DIAGNOSIS — F411 Generalized anxiety disorder: Secondary | ICD-10-CM

## 2021-02-01 MED ORDER — PROPRANOLOL HCL 10 MG PO TABS
10.0000 mg | ORAL_TABLET | Freq: Two times a day (BID) | ORAL | 0 refills | Status: DC
Start: 2021-02-01 — End: 2021-05-31

## 2021-02-01 NOTE — Telephone Encounter (Signed)
Please contact pt to schedule OV per last AVS.   60 day supply sent to [facility] per refill protocol. AS, CMA

## 2021-02-01 NOTE — Addendum Note (Signed)
Addended by: Sylvester Harder on: 02/01/2021 09:35 AM   Modules accepted: Orders

## 2021-02-01 NOTE — Telephone Encounter (Signed)
Patient needs a refill on propranolol and uses [facility]. Patient states he has three pills left. Thanks

## 2021-02-01 NOTE — Telephone Encounter (Signed)
Left voicemail stating medication was sent in and to call back per last AVS

## 2021-02-12 ENCOUNTER — Other Ambulatory Visit: Payer: Self-pay | Admitting: Physician Assistant

## 2021-02-12 ENCOUNTER — Telehealth: Payer: Self-pay | Admitting: Physician Assistant

## 2021-02-12 DIAGNOSIS — K219 Gastro-esophageal reflux disease without esophagitis: Secondary | ICD-10-CM

## 2021-02-12 MED ORDER — PANTOPRAZOLE SODIUM 20 MG PO TBEC: DELAYED_RELEASE_TABLET | ORAL | 0 refills | Status: DC

## 2021-02-12 NOTE — Telephone Encounter (Signed)
Will send 60 day supply per refill protocol. Patient is due for an appointment. At last visit in 07/2020 was advised to follow up in 4 months. Recommend to use over the counter Debrox ear drops for cerumen removal if they feel stopped up.  Thank you, Kandis Cocking

## 2021-02-12 NOTE — Telephone Encounter (Signed)
Patient has a stopped up ear and needs a refill on protonix. Please advise, thanks.

## 2021-02-13 NOTE — Telephone Encounter (Signed)
Patient advised to use Debrox for ear and he is scheduled for March 25 th. Thanks

## 2021-02-23 ENCOUNTER — Other Ambulatory Visit: Payer: Self-pay

## 2021-02-23 ENCOUNTER — Ambulatory Visit (INDEPENDENT_AMBULATORY_CARE_PROVIDER_SITE_OTHER): Payer: Managed Care, Other (non HMO) | Admitting: Physician Assistant

## 2021-02-23 ENCOUNTER — Encounter: Payer: Self-pay | Admitting: Physician Assistant

## 2021-02-23 VITALS — BP 109/72 | HR 84 | Temp 99.0°F | Ht 69.0 in | Wt 145.3 lb

## 2021-02-23 DIAGNOSIS — E785 Hyperlipidemia, unspecified: Secondary | ICD-10-CM

## 2021-02-23 DIAGNOSIS — K219 Gastro-esophageal reflux disease without esophagitis: Secondary | ICD-10-CM | POA: Diagnosis not present

## 2021-02-23 DIAGNOSIS — H6121 Impacted cerumen, right ear: Secondary | ICD-10-CM

## 2021-02-23 DIAGNOSIS — R7989 Other specified abnormal findings of blood chemistry: Secondary | ICD-10-CM | POA: Diagnosis not present

## 2021-02-23 DIAGNOSIS — F411 Generalized anxiety disorder: Secondary | ICD-10-CM

## 2021-02-23 MED ORDER — PANTOPRAZOLE SODIUM 20 MG PO TBEC: DELAYED_RELEASE_TABLET | ORAL | 0 refills | Status: DC

## 2021-02-23 NOTE — Progress Notes (Signed)
Established Patient Office Visit  Subjective:  Patient ID: Seth Chavez, male    DOB: May 02, 1976  Age: 45 y.o. MRN: [id number]  CC:  Chief Complaint  Patient presents with  . Follow-up    Mood  . Hyperlipidemia    HPI Seth Chavez presents for follow up on mood management and hyperlipidemia. Patient has c/o right ear fullness and decreased hearing. Denies otorrhea, fever or otalgia. Has tried Debrox ear drops with minimal success. Continues to attend AA meetings and reports having trouble with hearing other members and is constantly asking people to repeat themselves.  HLD: States has reduced eating out as much and is trying to stay as active as possible with walking. Also incorporates fruits and vegetables into his diet.  GERD: Reports is taking pantoprazole 20 mg once daily.   Mood: Taking medication without issues. Denies mood changes or SI/HI.   Depression screen [facility] 2/9 02/23/2021 07/19/2020 03/06/2020 05/31/2019 01/25/2019  Decreased Interest 0 0 0 0 1  Down, Depressed, Hopeless 0 0 0 0 2  PHQ - 2 Score 0 0 0 0 3  Altered sleeping 0 0 0 0 0  Tired, decreased energy 0 1 0 0 1  Change in appetite 0 0 0 0 1  Feeling bad or failure about yourself  0 0 0 0 1  Trouble concentrating 0 0 0 0 1  Moving slowly or fidgety/restless 0 0 0 0 3  Suicidal thoughts 0 0 0 0 0  PHQ-9 Score 0 1 0 0 10  Difficult doing work/chores - Not difficult at all - - Somewhat difficult     Past Medical History:  Diagnosis Date  . Anxiety     Past Surgical History:  Procedure Laterality Date  . FOOT SURGERY    . MANDIBLE SURGERY      Family History  Problem Relation Age of Onset  . Depression Father   . Heart attack Father   . Alcohol abuse Brother     Social History   Socioeconomic History  . Marital status: Married    Spouse name: Not on file  . Number of children: Not on file  . Years of education: Not on file  . Highest education level: Not on file  Occupational History  .  Not on file  Tobacco Use  . Smoking status: Former Smoker    Packs/day: 0.50    Years: 13.00    Pack years: 6.50    Types: Cigarettes    Quit date: 12/02/1997    Years since quitting: 23.2  . Smokeless tobacco: Current User    Types: Snuff  Vaping Use  . Vaping Use: Never used  Substance and Sexual Activity  . Alcohol use: Yes    Comment: daily drinker  . Drug use: Never  . Sexual activity: Yes    Birth control/protection: None  Other Topics Concern  . Not on file  Social History Narrative  . Not on file   Social Determinants of Health   Financial Resource Strain: Not on file  Food Insecurity: Not on file  Transportation Needs: Not on file  Physical Activity: Not on file  Stress: Not on file  Social Connections: Not on file  Intimate Partner Violence: Not on file    Outpatient Medications Prior to Visit  Medication Sig Dispense Refill  . Omega-3 Fatty Acids (FISH OIL) 1000 MG CAPS Take 3,000 mg by mouth daily.    . propranolol (INDERAL) 10 MG tablet Take 1 tablet (  10 mg total) by mouth 2 (two) times daily. **NEEDS APT FOR REFILLS** 120 tablet 0  . vitamin B-12 (CYANOCOBALAMIN) 1000 MCG tablet Take 1,000 mcg by mouth daily.    . pantoprazole (PROTONIX) 20 MG tablet TAKE 1 TABLET BY MOUTH TWICE DAILY. 120 tablet 0   No facility-administered medications prior to visit.    No Known Allergies  ROS Review of Systems A fourteen system review of systems was performed and found to be positive as per HPI.    Objective:    Physical Exam General:  Well Developed, well nourished, appropriate for stated age.  Neuro:  Alert and oriented,  extra-ocular muscles intact  HEENT:  Normocephalic, atraumatic, neck supple, normal TM of left ear, cerumen impaction of right ear Skin:  no gross rash, warm, pink. Cardiac:  RRR, S1 S2 Respiratory:  ECTA B/L, Not using accessory muscles, speaking in full sentences- unlabored. Vascular:  Ext warm, no cyanosis apprec.; cap RF less 2  sec. Psych:  No HI/SI, judgement and insight good, Euthymic mood. Full Affect.  BP 109/72   Pulse 84   Temp 99 F (37.2 C)   Ht $R'5\' 9"'nx$  (1.753 m)   Wt 145 lb 4.8 oz (65.9 kg)   SpO2 99%   BMI 21.46 kg/m  Wt Readings from Last 3 Encounters:  02/23/21 145 lb 4.8 oz (65.9 kg)  07/19/20 143 lb 4.8 oz (65 kg)  03/06/20 145 lb (65.8 kg)     Health Maintenance Due  Topic Date Due  . TETANUS/TDAP  Never done  . COVID-19 Vaccine (3 - Booster for Pfizer series) 09/07/2020    There are no preventive care reminders to display for this patient.  Lab Results  Component Value Date   TSH 2.350 06/26/2020   Lab Results  Component Value Date   WBC 4.8 06/26/2020   HGB 15.7 06/26/2020   HCT 48.0 06/26/2020   MCV 90 06/26/2020   PLT 243 06/26/2020   Lab Results  Component Value Date   NA 142 06/26/2020   K 4.4 06/26/2020   CO2 26 06/26/2020   GLUCOSE 97 06/26/2020   BUN 21 06/26/2020   CREATININE 0.94 06/26/2020   BILITOT 0.6 06/26/2020   ALKPHOS 40 (L) 06/26/2020   AST 12 06/26/2020   ALT 14 06/26/2020   PROT 7.2 06/26/2020   ALBUMIN 4.7 06/26/2020   CALCIUM 9.7 06/26/2020   ANIONGAP 17 (H) 02/22/2019   Lab Results  Component Value Date   CHOL 273 (H) 06/26/2020   Lab Results  Component Value Date   HDL 33 (L) 06/26/2020   Lab Results  Component Value Date   LDLCALC 202 (H) 06/26/2020   Lab Results  Component Value Date   TRIG 198 (H) 06/26/2020   Lab Results  Component Value Date   CHOLHDL 8.3 (H) 06/26/2020   Lab Results  Component Value Date   HGBA1C 5.3 06/26/2020      Assessment & Plan:   Problem List Items Addressed This Visit      Digestive   Gastroesophageal reflux disease   Relevant Medications   pantoprazole (PROTONIX) 20 MG tablet     Other   Elevated LFTs   Relevant Orders   Comp Met (CMET)    Other Visit Diagnoses    GAD (generalized anxiety disorder)    -  Primary   Hyperlipidemia, unspecified hyperlipidemia type        Relevant Orders   Lipid Profile   Comp Met (CMET)   Impacted  cerumen of right ear         GAD: -Stable. -Continue current medication regimen. -Will continue to monitor.  Hyperlipidemia: -Last lipid panel: total cholesterol 273, triglycerides 198, HDL 33, LDL 202. The 10-year ASCVD risk score Mikey Bussing DC Jr., et al., 2013) is: 3.7% -Will repeat lipid panel today. Recommend to continue reducing saturated and trans fats. Continue with walking regimen. -Will continue to monitor.  Impacted cerumen of right ear: -Attempted ear irrigation with minimal success. Advised patient to schedule nurse visit when back in town for another attempt and instructed to apply debrox ear drops the night prior. Patient verbalized understanding. -Indication: Cerumen impaction of the right ear(s) Medical necessity statement:  On physical examination, cerumen impairs clinically significant portions of the external auditory canal, and tympanic membrane.  Consent:  Discussed benefits and risks of procedure and verbal consent obtained Procedure:   Patient was prepped for the procedure.  Utilized an otoscope to assess and take note of the ear canal, the tympanic membrane, and the presence, amount, and placement of the cerumen. Gentle water irrigation and soft plastic curette was utilized to attempt to remove cerumen. Post procedure examination:  cerumen removal unsuccessful   GERD: -Patient was discharged on pantoprazole 20 mg BID from hospital admission 04/01/2019 with [facility] for hiccups/GERD. Reviewed discharge summary. -Patient taking 20 mg once daily and symptoms are controlled so recommend to continue with current medication regimen. Recommend treatment adjustments in the near future by taking PPI on-demand.  Elevated LFTs: -Patient continues to abstain from alcohol and attend AA meetings. -Will continue to monitor hepatic function. Last hepatic function had improved from prior.  Meds ordered this encounter   Medications  . pantoprazole (PROTONIX) 20 MG tablet    Sig: TAKE 1 TABLET BY MOUTH DAILY.    Dispense:  90 tablet    Refill:  0    Order Specific Question:   Supervising Provider    Answer:   Beatrice Lecher D [[id number]]    Follow-up: Return in about 6 months (around 08/26/2021) for CPE and FBW/ nurse visit for earwax removal .   Note:  This note was prepared with assistance of Dragon voice recognition software. Occasional wrong-word or sound-a-like substitutions may have occurred due to the inherent limitations of voice recognition software.  Lorrene Reid, PA-C

## 2021-02-23 NOTE — Patient Instructions (Signed)
Earwax Buildup, Adult The ears produce a substance called earwax that helps keep bacteria out of the ear and protects the skin in the ear canal. Occasionally, earwax can build up in the ear and cause discomfort or hearing loss. What are the causes? This condition is caused by a buildup of earwax. Ear canals are self-cleaning. Ear wax is made in the outer part of the ear canal and generally falls out in small amounts over time. When the self-cleaning mechanism is not working, earwax builds up and can cause decreased hearing and discomfort. Attempting to clean ears with cotton swabs can push the earwax deep into the ear canal and cause decreased hearing and pain. What increases the risk? This condition is more likely to develop in people who:  Clean their ears often with cotton swabs.  Pick at their ears.  Use earplugs or in-ear headphones often, or wear hearing aids. The following factors may also make you more likely to develop this condition:  Being male.  Being of older age.  Naturally producing more earwax.  Having narrow ear canals.  Having earwax that is overly thick or sticky.  Having excess hair in the ear canal.  Having eczema.  Being dehydrated. What are the signs or symptoms? Symptoms of this condition include:  Reduced or muffled hearing.  A feeling of fullness in the ear or feeling that the ear is plugged.  Fluid coming from the ear.  Ear pain or an itchy ear.  Ringing in the ear.  Coughing.  Balance problems.  An obvious piece of earwax that can be seen inside the ear canal. How is this diagnosed? This condition may be diagnosed based on:  Your symptoms.  Your medical history.  An ear exam. During the exam, your health care provider will look into your ear with an instrument called an otoscope. You may have tests, including a hearing test. How is this treated? This condition may be treated by:  Using ear drops to soften the earwax.  Having  the earwax removed by a health care provider. The health care provider may: ? Flush the ear with water. ? Use an instrument that has a loop on the end (curette). ? Use a suction device.  Having surgery to remove the wax buildup. This may be done in severe cases. Follow these instructions at home:  Take over-the-counter and prescription medicines only as told by your health care provider.  Do not put any objects, including cotton swabs, into your ear. You can clean the opening of your ear canal with a washcloth or facial tissue.  Follow instructions from your health care provider about cleaning your ears. Do not overclean your ears.  Drink enough fluid to keep your urine pale yellow. This will help to thin the earwax.  Keep all follow-up visits as told. If earwax builds up in your ears often or if you use hearing aids, consider seeing your health care provider for routine, preventive ear cleanings. Ask your health care provider how often you should schedule your cleanings.  If you have hearing aids, clean them according to instructions from the manufacturer and your health care provider.   Contact a health care provider if:  You have ear pain.  You develop a fever.  You have pus or other fluid coming from your ear.  You have hearing loss.  You have ringing in your ears that does not go away.  You feel like the room is spinning (vertigo).  Your symptoms do not   improve with treatment. Get help right away if:  You have bleeding from the affected ear.  You have severe ear pain. Summary  Earwax can build up in the ear and cause discomfort or hearing loss.  The most common symptoms of this condition include reduced or muffled hearing, a feeling of fullness in the ear, or feeling that the ear is plugged.  This condition may be diagnosed based on your symptoms, your medical history, and an ear exam.  This condition may be treated by using ear drops to soften the earwax or by  having the earwax removed by a health care provider.  Do not put any objects, including cotton swabs, into your ear. You can clean the opening of your ear canal with a washcloth or facial tissue. This information is not intended to replace advice given to you by your health care provider. Make sure you discuss any questions you have with your health care provider. Document Revised: 03/07/2020 Document Reviewed: 03/07/2020 Elsevier Patient Education  2021 Elsevier Inc.   Food Choices for Gastroesophageal Reflux Disease, Adult When you have gastroesophageal reflux disease (GERD), the foods you eat and your eating habits are very important. Choosing the right foods can help ease your discomfort. Think about working with a food expert (dietitian) to help you make good choices. What are tips for following this plan? Reading food labels  Look for foods that are low in saturated fat. Foods that may help with your symptoms include: ? Foods that have less than 5% of daily value (DV) of fat. ? Foods that have 0 grams of trans fat. Cooking  Do not fry your food.  Cook your food by baking, steaming, grilling, or broiling. These are all methods that do not need a lot of fat for cooking.  To add flavor, try to use herbs that are low in spice and acidity. Meal planning  Choose healthy foods that are low in fat, such as: ? Fruits and vegetables. ? Whole grains. ? Low-fat dairy products. ? Lean meats, fish, and poultry.  Eat small meals often instead of eating 3 large meals each day. Eat your meals slowly in a place where you are relaxed. Avoid bending over or lying down until 2-3 hours after eating.  Limit high-fat foods such as fatty meats or fried foods.  Limit your intake of fatty foods, such as oils, butter, and shortening.  Avoid the following as told by your doctor: ? Foods that cause symptoms. These may be different for different people. Keep a food diary to keep track of foods that  cause symptoms. ? Alcohol. ? Drinking a lot of liquid with meals. ? Eating meals during the 2-3 hours before bed.   Lifestyle  Stay at a healthy weight. Ask your doctor what weight is healthy for you. If you need to lose weight, work with your doctor to do so safely.  Exercise for at least 30 minutes on 5 or more days each week, or as told by your doctor.  Wear loose-fitting clothes.  Do not smoke or use any products that contain nicotine or tobacco. If you need help quitting, ask your doctor.  Sleep with the head of your bed higher than your feet. Use a wedge under the mattress or blocks under the bed frame to raise the head of the bed.  Chew sugar-free gum after meals. What foods should eat? Eat a healthy, well-balanced diet of fruits, vegetables, whole grains, low-fat dairy products, lean meats, fish, and poultry.  Each person is different. Foods that may cause symptoms in one person may not cause any symptoms in another person. Work with your doctor to find foods that are safe for you. The items listed above may not be a complete list of what you can eat and drink. Contact a food expert for more options.   What foods should I avoid? Limiting some of these foods may help in managing the symptoms of GERD. Everyone is different. Talk with a food expert or your doctor to help you find the exact foods to avoid, if any. Fruits Any fruits prepared with added fat. Any fruits that cause symptoms. For some people, this may include citrus fruits, such as oranges, grapefruit, pineapple, and lemons. Vegetables Deep-fried vegetables. [place] fries. Any vegetables prepared with added fat. Any vegetables that cause symptoms. For some people, this may include tomatoes and tomato products, chili peppers, onions and garlic, and horseradish. Grains Pastries or quick breads with added fat. Meats and other proteins High-fat meats, such as fatty beef or pork, hot dogs, ribs, ham, sausage, salami, and bacon.  Fried meat or protein, including fried fish and fried chicken. Nuts and nut butters, in large amounts. Dairy Whole milk and chocolate milk. Sour cream. Cream. Ice cream. Cream cheese. Milkshakes. Fats and oils Butter. Margarine. Shortening. Ghee. Beverages Coffee and tea, with or without caffeine. Carbonated beverages. Sodas. Energy drinks. Fruit juice made with acidic fruits, such as orange or grapefruit. Tomato juice. Alcoholic drinks. Sweets and desserts Chocolate and cocoa. Donuts. Seasonings and condiments Pepper. Peppermint and spearmint. Added salt. Any condiments, herbs, or seasonings that cause symptoms. For some people, this may include curry, hot sauce, or vinegar-based salad dressings. The items listed above may not be a complete list of what you should not eat and drink. Contact a food expert for more options. Questions to ask your doctor Diet and lifestyle changes are often the first steps that are taken to manage symptoms of GERD. If diet and lifestyle changes do not help, talk with your doctor about taking medicines. Where to find more information  International Foundation for Gastrointestinal Disorders: aboutgerd.org Summary  When you have GERD, food and lifestyle choices are very important in easing your symptoms.  Eat small meals often instead of 3 large meals a day. Eat your meals slowly and in a place where you are relaxed.  Avoid bending over or lying down until 2-3 hours after eating.  Limit high-fat foods such as fatty meats or fried foods. This information is not intended to replace advice given to you by your health care provider. Make sure you discuss any questions you have with your health care provider. Document Revised: 05/29/2020 Document Reviewed: 05/29/2020 Elsevier Patient Education  2021 [employer].

## 2021-02-24 LAB — COMPREHENSIVE METABOLIC PANEL
ALT: 12 IU/L (ref 0–44)
AST: 7 IU/L (ref 0–40)
Albumin/Globulin Ratio: 2.3 — ABNORMAL HIGH (ref 1.2–2.2)
Albumin: 5.1 g/dL — ABNORMAL HIGH (ref 4.0–5.0)
Alkaline Phosphatase: 37 IU/L — ABNORMAL LOW (ref 44–121)
BUN/Creatinine Ratio: 21 — ABNORMAL HIGH (ref 9–20)
BUN: 21 mg/dL (ref 6–24)
Bilirubin Total: 0.6 mg/dL (ref 0.0–1.2)
CO2: 25 mmol/L (ref 20–29)
Calcium: 9.5 mg/dL (ref 8.7–10.2)
Chloride: 101 mmol/L (ref 96–106)
Creatinine, Ser: 1.02 mg/dL (ref 0.76–1.27)
Globulin, Total: 2.2 g/dL (ref 1.5–4.5)
Glucose: 98 mg/dL (ref 65–99)
Potassium: 4.6 mmol/L (ref 3.5–5.2)
Sodium: 141 mmol/L (ref 134–144)
Total Protein: 7.3 g/dL (ref 6.0–8.5)
eGFR: 94 mL/min/{1.73_m2} (ref >59)

## 2021-02-24 LAB — LIPID PANEL
Chol/HDL Ratio: 7.9 ratio — ABNORMAL HIGH (ref 0.0–5.0)
Cholesterol, Total: 275 mg/dL — ABNORMAL HIGH (ref 100–199)
HDL: 35 mg/dL — ABNORMAL LOW (ref >39)
LDL Chol Calc (NIH): 212 mg/dL — ABNORMAL HIGH (ref 0–99)
Triglycerides: 146 mg/dL (ref 0–149)
VLDL Cholesterol Cal: 28 mg/dL (ref 5–40)

## 2021-05-31 ENCOUNTER — Telehealth: Payer: Self-pay | Admitting: Physician Assistant

## 2021-05-31 DIAGNOSIS — F411 Generalized anxiety disorder: Secondary | ICD-10-CM

## 2021-05-31 MED ORDER — PROPRANOLOL HCL 10 MG PO TABS: 10.0000 mg | ORAL_TABLET | Freq: Two times a day (BID) | ORAL | 0 refills | Status: DC

## 2021-05-31 NOTE — Telephone Encounter (Signed)
Patient needs refill on below medication.  Patient was seen on 02/23/21 and has scheduled appointment on 08/31/21 for his 6 month follow-up.    propranolol (INDERAL) 10 MG tablet [[id number]]    Order Details Dose: 10 mg Route: Oral Frequency: 2 times daily  Dispense Quantity: 120 tablet Refills: 0        Sig: Take 1 tablet (10 mg total) by mouth 2 (two) times daily. **NEEDS APT FOR REFILLS**       Start Date: 02/01/21 End Date: --  Written Date: 02/01/21 Expiration Date: 02/01/22     Diagnosis Association: GAD (generalized anxiety disorder) (F41.1)  Original Order:  propranolol (INDERAL) 10 MG tablet [[id number]]   Providers

## 2021-07-03 ENCOUNTER — Telehealth: Payer: Self-pay | Admitting: Physician Assistant

## 2021-07-03 ENCOUNTER — Other Ambulatory Visit: Payer: Self-pay

## 2021-07-03 DIAGNOSIS — K219 Gastro-esophageal reflux disease without esophagitis: Secondary | ICD-10-CM

## 2021-07-03 MED ORDER — PANTOPRAZOLE SODIUM 20 MG PO TBEC: DELAYED_RELEASE_TABLET | ORAL | 0 refills | Status: DC

## 2021-07-03 NOTE — Telephone Encounter (Signed)
Patient would like a refill on Protonix and uses [facility], thanks.

## 2021-07-03 NOTE — Telephone Encounter (Signed)
Refill has been sent to [facility].

## 2021-08-31 ENCOUNTER — Encounter: Payer: Self-pay | Admitting: Physician Assistant

## 2021-08-31 ENCOUNTER — Other Ambulatory Visit: Payer: Self-pay

## 2021-08-31 ENCOUNTER — Ambulatory Visit (INDEPENDENT_AMBULATORY_CARE_PROVIDER_SITE_OTHER): Payer: Managed Care, Other (non HMO) | Admitting: Physician Assistant

## 2021-08-31 VITALS — BP 114/70 | HR 74 | Temp 99.0°F | Ht 70.0 in | Wt 150.2 lb

## 2021-08-31 DIAGNOSIS — Z1329 Encounter for screening for other suspected endocrine disorder: Secondary | ICD-10-CM

## 2021-08-31 DIAGNOSIS — Z23 Encounter for immunization: Secondary | ICD-10-CM | POA: Diagnosis not present

## 2021-08-31 DIAGNOSIS — Z0000 Encounter for general adult medical examination without abnormal findings: Secondary | ICD-10-CM

## 2021-08-31 DIAGNOSIS — Z1321 Encounter for screening for nutritional disorder: Secondary | ICD-10-CM

## 2021-08-31 DIAGNOSIS — Z130 Encounter for screening for diseases of the blood and blood-forming organs and certain disorders involving the immune mechanism: Secondary | ICD-10-CM

## 2021-08-31 DIAGNOSIS — Z13228 Encounter for screening for other metabolic disorders: Secondary | ICD-10-CM

## 2021-08-31 DIAGNOSIS — H6121 Impacted cerumen, right ear: Secondary | ICD-10-CM

## 2021-08-31 NOTE — Patient Instructions (Signed)
Preventive Care 21-44 Years Old, Male Preventive care refers to lifestyle choices and visits with your health care provider that can promote health and wellness. This includes: A yearly physical exam. This is also called an annual wellness visit. Regular dental and eye exams. Immunizations. Screening for certain conditions. Healthy lifestyle choices, such as: Eating a healthy diet. Getting regular exercise. Not using drugs or products that contain nicotine and tobacco. Limiting alcohol use. What can I expect for my preventive care visit? Physical exam Your health care provider may check your: Height and weight. These may be used to calculate your BMI (body mass index). BMI is a measurement that tells if you are at a healthy weight. Heart rate and blood pressure. Body temperature. Skin for abnormal spots. Counseling Your health care provider may ask you questions about your: Past medical problems. Family's medical history. Alcohol, tobacco, and drug use. Emotional well-being. Home life and relationship well-being. Sexual activity. Diet, exercise, and sleep habits. Work and work environment. Access to firearms. What immunizations do I need? Vaccines are usually given at various ages, according to a schedule. Your health care provider will recommend vaccines for you based on your age, medical history, and lifestyle or other factors, such as travel or where you work. What tests do I need? Blood tests Lipid and cholesterol levels. These may be checked every 5 years starting at age 20. Hepatitis C test. Hepatitis B test. Screening  Diabetes screening. This is done by checking your blood sugar (glucose) after you have not eaten for a while (fasting). Genital exam to check for testicular cancer or hernias. STD (sexually transmitted disease) testing, if you are at risk. Talk with your health care provider about your test results, treatment options, and if necessary, the need for more  tests. Follow these instructions at home: Eating and drinking  Eat a healthy diet that includes fresh fruits and vegetables, whole grains, lean protein, and low-fat dairy products. Drink enough fluid to keep your urine pale yellow. Take vitamin and mineral supplements as recommended by your health care provider. Do not drink alcohol if your health care provider tells you not to drink. If you drink alcohol: Limit how much you have to 0-2 drinks a day. Be aware of how much alcohol is in your drink. In the [place] one drink equals one 12 oz bottle of beer (355 mL), one 5 oz glass of wine (148 mL), or one 1 oz glass of hard liquor (44 mL). Lifestyle Take daily care of your teeth and gums. Brush your teeth every morning and night with fluoride toothpaste. Floss one time each day. Stay active. Exercise for at least 30 minutes 5 or more days each week. Do not use any products that contain nicotine or tobacco, such as cigarettes, e-cigarettes, and chewing tobacco. If you need help quitting, ask your health care provider. Do not use drugs. If you are sexually active, practice safe sex. Use a condom or other form of protection to prevent STIs (sexually transmitted infections). Find healthy ways to cope with stress, such as: Meditation, yoga, or listening to music. Journaling. Talking to a trusted person. Spending time with friends and family. Safety Always wear your seat belt while driving or riding in a vehicle. Do not drive: If you have been drinking alcohol. Do not ride with someone who has been drinking. When you are tired or distracted. While texting. Wear a helmet and other protective equipment during sports activities. If you have firearms in your house, make sure   you follow all gun safety procedures. Seek help if you have been physically or sexually abused. What's next? Go to your health care provider once a year for an annual wellness visit. Ask your health care provider how often you  should have your eyes and teeth checked. Stay up to date on all vaccines. This information is not intended to replace advice given to you by your health care provider. Make sure you discuss any questions you have with your health care provider. Document Revised: 01/26/2021 Document Reviewed: 11/12/2018 Elsevier Patient Education  2022 Elsevier Inc.  

## 2021-08-31 NOTE — Progress Notes (Signed)
Male physical   Impression and Recommendations:    1. Healthcare maintenance   2. Need for diphtheria-tetanus-pertussis (Tdap) vaccine   3. Need for influenza vaccination   4. Screening for endocrine, nutritional, metabolic and immunity disorder   5. Impacted cerumen of right ear      1) Anticipatory Guidance: Discussed skin CA prevention and sunscreen when outside along with skin surveillance; eating a balanced and modest diet; physical activity at least 25 minutes per day or minimum of 150 min/ week moderate to intense activity.  2) Immunizations / Screenings / Labs:   All immunizations are up-to-date per recommendations or will be updated today if pt allows.    - Patient understands with dental and vision screens they will schedule independently.  - Will obtain CBC, CMP, HgA1c, Lipid panel, TSH when fasting, if not already done past 12 mo/ recently.  -Agreeable to Tdap and influenza vaccines.  3) Weight: Continue to improve diet habits to improve overall feelings of well being and objective health data. Improve nutrient density of diet through increasing intake of fruits and vegetables and decreasing saturated fats, white flour products and refined sugars.   4) Healthcare Maintenance: -Continue current medication regimen. -Patient declined right ear lavage, recommend to use OTC debrox ear drops. -Stay well hydrated. -Follow up in 6 months for mood, GERD   Orders Placed This Encounter  Procedures   Flu Vaccine QUAD 25mo+IM (Fluarix, Fluzone & Alfiuria Quad PF)   Tdap vaccine greater than or equal to 7yo IM   CBC with Differential/Platelet    Standing Status:   Future    Number of Occurrences:   1    Standing Expiration Date:   09/30/2021   Lipid panel    Standing Status:   Future    Number of Occurrences:   1    Standing Expiration Date:   09/30/2021    Order Specific Question:   Has the patient fasted?    Answer:   Yes   TSH    Standing Status:   Future     Number of Occurrences:   1    Standing Expiration Date:   09/30/2021   Comprehensive metabolic panel    Standing Status:   Future    Number of Occurrences:   1    Standing Expiration Date:   09/30/2021    Order Specific Question:   Has the patient fasted?    Answer:   Yes   Hemoglobin A1c    Standing Status:   Future    Number of Occurrences:   1    Standing Expiration Date:   09/30/2021    No orders of the defined types were placed in this encounter.    Return in about 6 months (around 02/28/2022) for Mood, GERD .     Gross side effects, risk and benefits, and alternatives of medications discussed with patient.  Patient is aware that all medications have potential side effects and we are unable to predict every side effect or drug-drug interaction that may occur.  Expresses verbal understanding and consents to current therapy plan and treatment regimen.  Please see AVS handed out to patient at the end of our visit for further patient instructions/ counseling done pertaining to today's office visit.       Subjective:        CC: CPE   HPI: Seth Chavez is a 45 y.o. male who presents to [facility] today  for a yearly health maintenance exam.     Health Maintenance Summary  - Reviewed and updated, unless pt declines services.  Last Cologuard or Colonoscopy:   n/a  Family history of Colon CA:  no  Tobacco History Reviewed:   yes, former smoker Abdominal Ultrasound:   n/a   CT scan for screening lung CA:   n/a     Alcohol / drug use: No use / no use Exercise Habits: walking   Eye exams: y   Male history: STD concerns:   none, monogamous Additional penile/ urinary concerns:   none   Additional concerns beyond Health Maintenance issues:  none     Immunization History  Administered Date(s) Administered   Influenza,inj,Quad PF,6+ Mos 08/31/2021   PFIZER(Purple Top)SARS-COV-2 Vaccination 02/09/2020, 03/08/2020, 09/29/2020   Tdap  08/31/2021     Health Maintenance  Topic Date Due   TETANUS/TDAP  09/01/2031   INFLUENZA VACCINE  Completed   COVID-19 Vaccine  Completed   Hepatitis C Screening  Completed   HIV Screening  Completed   HPV VACCINES  Aged Out       Wt Readings from Last 3 Encounters:  08/31/21 150 lb 3.2 oz (68.1 kg)  02/23/21 145 lb 4.8 oz (65.9 kg)  07/19/20 143 lb 4.8 oz (65 kg)   BP Readings from Last 3 Encounters:  08/31/21 114/70  02/23/21 109/72  07/19/20 120/80   Pulse Readings from Last 3 Encounters:  08/31/21 74  02/23/21 84  07/19/20 67    Patient Active Problem List   Diagnosis Date Noted   Gastroesophageal reflux disease 03/06/2020   Alcohol withdrawal syndrome without complication (HCC) 04/02/2019   Mild protein-calorie malnutrition (HCC) 04/02/2019   Non-intractable vomiting with nausea 04/02/2019   Suicidal ideation 04/02/2019   Alcohol abuse 01/25/2019   Healthcare maintenance 01/25/2019   Generalized anxiety disorder 01/25/2019   Elevated LFTs 01/25/2019    Past Medical History:  Diagnosis Date   Anxiety     Past Surgical History:  Procedure Laterality Date   FOOT SURGERY     MANDIBLE SURGERY      Family History  Problem Relation Age of Onset   Depression Father    Heart attack Father    Alcohol abuse Brother     Social History   Substance and Sexual Activity  Drug Use Never  ,  Social History   Substance and Sexual Activity  Alcohol Use Yes   Comment: daily drinker  ,  Social History   Tobacco Use  Smoking Status Former   Packs/day: 0.50   Years: 13.00   Pack years: 6.50   Types: Cigarettes   Quit date: 12/02/1997   Years since quitting: 23.7  Smokeless Tobacco Current   Types: Snuff  ,  Social History   Substance and Sexual Activity  Sexual Activity Yes   Birth control/protection: None    Patient's Medications  New Prescriptions   No medications on file  Previous Medications   OMEGA-3 FATTY ACIDS (FISH OIL) 1000 MG CAPS     Take 3,000 mg by mouth daily.   PANTOPRAZOLE (PROTONIX) 20 MG TABLET    TAKE 1 TABLET BY MOUTH DAILY.   PROPRANOLOL (INDERAL) 10 MG TABLET    Take 1 tablet (10 mg total) by mouth 2 (two) times daily.   VITAMIN B-12 (CYANOCOBALAMIN) 1000 MCG TABLET    Take 1,000 mcg by mouth daily.  Modified Medications   No medications on file  Discontinued Medications   No medications on  file    Patient has no known allergies.  Review of Systems: General:   Denies fever, chills, unexplained weight loss.  Optho/Auditory:   Denies visual changes, blurred vision/LOV Respiratory:   Denies SOB, DOE more than baseline levels.   Cardiovascular:   Denies chest pain, palpitations, new onset peripheral edema  Gastrointestinal:   Denies nausea, vomiting, diarrhea.  Genitourinary: Denies dysuria, freq/ urgency, flank pain  Endocrine:     Denies hot or cold intolerance, polyuria, polydipsia. Musculoskeletal:   Denies unexplained myalgias, joint swelling, unexplained arthralgias. Skin:  Denies rash, suspicious lesions Neurological:     Denies dizziness, unexplained weakness, numbness  Psychiatric/Behavioral:   Denies mood changes, suicidal or homicidal ideations, hallucinations    Objective:     Blood pressure 114/70, pulse 74, temperature 99 F (37.2 C), height 5\' 10"  (1.778 m), weight 150 lb 3.2 oz (68.1 kg), SpO2 100 %. Body mass index is 21.55 kg/m. General Appearance:    Alert, cooperative, no distress, appears stated age  Head:    Normocephalic, without obvious abnormality, atraumatic  Eyes:    PERRL, conjunctiva/corneas clear, EOM's intact, fundi    benign, both eyes  Ears:    Normal TM's and external ear canals, left ear; cerumen impaction of right ear  Nose:   Nares normal, septum midline, mucosa normal, no drainage    or sinus tenderness  Throat:   Lips w/o lesion, mucosa moist, and tongue normal; teeth and   gums normal  Neck:   Supple, symmetrical, trachea midline, no adenopathy;     thyroid:  no enlargement/tenderness/nodules; no carotid   bruit or JVD  Back:     Symmetric, no curvature, ROM normal, no CVA tenderness  Lungs:     Clear to auscultation bilaterally, respirations unlabored, no       Wh/ R/ R  Chest Wall:    No tenderness or gross deformity; normal excursion   Heart:    Regular rate and rhythm, S1 and S2 normal, no murmur, rub   or gallop  Abdomen:     Soft, non-tender, bowel sounds active all four quadrants, NO   G/R/R, no masses, no organomegaly  Genitalia:   Deferred.  Rectal:    Deferred.  Extremities:   Extremities normal, atraumatic, no cyanosis or gross edema  Pulses:   2+ and symmetric all extremities  Skin:   Warm, dry, Skin color, texture, turgor normal, no obvious rashes or lesions  M-Sk:   Ambulates * 4 w/o difficulty, no gross deformities, tone WNL  Neurologic:   CNII-XII grossly intact Psych:  No HI/SI, judgement and insight good, Euthymic mood. Full Affect.

## 2021-09-01 LAB — CBC WITH DIFFERENTIAL/PLATELET
Basophils Absolute: 0 10*3/uL (ref 0.0–0.2)
Basos: 1 %
EOS (ABSOLUTE): 0.1 10*3/uL (ref 0.0–0.4)
Eos: 1 %
Hematocrit: 46.5 % (ref 37.5–51.0)
Hemoglobin: 15.9 g/dL (ref 13.0–17.7)
Immature Grans (Abs): 0 10*3/uL (ref 0.0–0.1)
Immature Granulocytes: 1 %
Lymphocytes Absolute: 1.7 10*3/uL (ref 0.7–3.1)
Lymphs: 37 %
MCH: 30.1 pg (ref 26.6–33.0)
MCHC: 34.2 g/dL (ref 31.5–35.7)
MCV: 88 fL (ref 79–97)
Monocytes Absolute: 0.3 10*3/uL (ref 0.1–0.9)
Monocytes: 7 %
Neutrophils Absolute: 2.4 10*3/uL (ref 1.4–7.0)
Neutrophils: 53 %
Platelets: 262 10*3/uL (ref 150–450)
RBC: 5.28 x10E6/uL (ref 4.14–5.80)
RDW: 11.9 % (ref 11.6–15.4)
WBC: 4.4 10*3/uL (ref 3.4–10.8)

## 2021-09-01 LAB — COMPREHENSIVE METABOLIC PANEL
ALT: 17 IU/L (ref 0–44)
AST: 15 IU/L (ref 0–40)
Albumin/Globulin Ratio: 2.1 (ref 1.2–2.2)
Albumin: 4.9 g/dL (ref 4.0–5.0)
Alkaline Phosphatase: 36 IU/L — ABNORMAL LOW (ref 44–121)
BUN/Creatinine Ratio: 23 — ABNORMAL HIGH (ref 9–20)
BUN: 24 mg/dL (ref 6–24)
Bilirubin Total: 0.7 mg/dL (ref 0.0–1.2)
CO2: 24 mmol/L (ref 20–29)
Calcium: 9.5 mg/dL (ref 8.7–10.2)
Chloride: 101 mmol/L (ref 96–106)
Creatinine, Ser: 1.03 mg/dL (ref 0.76–1.27)
Globulin, Total: 2.3 g/dL (ref 1.5–4.5)
Glucose: 83 mg/dL (ref 70–99)
Potassium: 4.2 mmol/L (ref 3.5–5.2)
Sodium: 141 mmol/L (ref 134–144)
Total Protein: 7.2 g/dL (ref 6.0–8.5)
eGFR: 92 mL/min/{1.73_m2} (ref >59)

## 2021-09-01 LAB — LIPID PANEL
Chol/HDL Ratio: 10 ratio — ABNORMAL HIGH (ref 0.0–5.0)
Cholesterol, Total: 321 mg/dL — ABNORMAL HIGH (ref 100–199)
HDL: 32 mg/dL — ABNORMAL LOW (ref >39)
LDL Chol Calc (NIH): 250 mg/dL — ABNORMAL HIGH (ref 0–99)
Triglycerides: 191 mg/dL — ABNORMAL HIGH (ref 0–149)
VLDL Cholesterol Cal: 39 mg/dL (ref 5–40)

## 2021-09-01 LAB — HEMOGLOBIN A1C
Est. average glucose Bld gHb Est-mCnc: 108 mg/dL
Hgb A1c MFr Bld: 5.4 % (ref 4.8–5.6)

## 2021-09-01 LAB — TSH: TSH: 2.41 u[IU]/mL (ref 0.450–4.500)

## 2021-10-16 ENCOUNTER — Other Ambulatory Visit: Payer: Self-pay | Admitting: Physician Assistant

## 2021-10-16 DIAGNOSIS — F411 Generalized anxiety disorder: Secondary | ICD-10-CM

## 2021-10-16 DIAGNOSIS — K219 Gastro-esophageal reflux disease without esophagitis: Secondary | ICD-10-CM

## 2022-02-20 ENCOUNTER — Ambulatory Visit: Payer: Self-pay | Admitting: Nurse Practitioner

## 2022-05-31 ENCOUNTER — Other Ambulatory Visit: Payer: Self-pay | Admitting: Physician Assistant

## 2022-05-31 DIAGNOSIS — K219 Gastro-esophageal reflux disease without esophagitis: Secondary | ICD-10-CM

## 2022-09-09 ENCOUNTER — Other Ambulatory Visit: Payer: Self-pay | Admitting: Physician Assistant

## 2022-09-09 DIAGNOSIS — K219 Gastro-esophageal reflux disease without esophagitis: Secondary | ICD-10-CM

## 2022-12-04 ENCOUNTER — Other Ambulatory Visit: Payer: Self-pay | Admitting: Nurse Practitioner

## 2022-12-04 DIAGNOSIS — K219 Gastro-esophageal reflux disease without esophagitis: Secondary | ICD-10-CM

## 2022-12-04 DIAGNOSIS — F411 Generalized anxiety disorder: Secondary | ICD-10-CM

## 2023-01-18 ENCOUNTER — Other Ambulatory Visit: Payer: Self-pay | Admitting: Nurse Practitioner

## 2023-01-18 DIAGNOSIS — K219 Gastro-esophageal reflux disease without esophagitis: Secondary | ICD-10-CM

## 2023-01-22 ENCOUNTER — Encounter: Payer: Self-pay | Admitting: Family Medicine

## 2023-01-22 ENCOUNTER — Ambulatory Visit: Payer: BC Managed Care – PPO | Admitting: Family Medicine

## 2023-01-22 VITALS — BP 122/85 | HR 72 | Resp 18 | Ht 70.0 in | Wt 138.0 lb

## 2023-01-22 DIAGNOSIS — F411 Generalized anxiety disorder: Secondary | ICD-10-CM

## 2023-01-22 DIAGNOSIS — K219 Gastro-esophageal reflux disease without esophagitis: Secondary | ICD-10-CM | POA: Diagnosis not present

## 2023-01-22 MED ORDER — PANTOPRAZOLE SODIUM 20 MG PO TBEC: 20.0000 mg | DELAYED_RELEASE_TABLET | Freq: Every day | ORAL | 1 refills | Status: DC

## 2023-01-22 MED ORDER — PROPRANOLOL HCL 10 MG PO TABS: 10.0000 mg | ORAL_TABLET | Freq: Two times a day (BID) | ORAL | 1 refills | Status: DC

## 2023-01-22 NOTE — Assessment & Plan Note (Signed)
Stable, GAD-7 score of 0 today.  Continue propranolol twice daily.  Will continue to monitor.

## 2023-01-22 NOTE — Assessment & Plan Note (Signed)
Stable.  Continue Protonix 20 mg daily.  Will continue to monitor.

## 2023-01-22 NOTE — Progress Notes (Signed)
Established Patient Office Visit  Subjective   Patient ID: Seth Chavez, male    DOB: Oct 10, 1976  Age: 47 y.o. MRN: [id number]  Chief Complaint  Patient presents with   Medical Management of Chronic Issues   Gastroesophageal Reflux   Mood    HPI Seth Chavez is a 47 y.o. male presenting today for follow up of GERD and mood. GERD: Patient currently taking pantoprazole, is not having side effects. He reports excellent compliance with treatment. Denies heartburn, dysphagia, nausea/vomiting, diarrhea, constipation, abdominal pain, chest pain, cough, early satiety, hematochezia/melena. Mood: Patient is here to follow up for anxiety. He is currently managing with propranolol. Taking medication without side effects, reports excellent compliance with treatment. Denies mood changes or SI/HI. He feels his anxiety is Unchanged since last visit. Denies chest pain, difficulty concentrating, dizziness, fatigue, insomnia, irritability, palpitations, panic attacks, racing thoughts, SOB, sweating. Denies anhedonia, depressed mood, difficulty concentrating, fatigue, feelings of worthlessness/guilt, hopelessness, hypersomnia, impaired memory, insomnia, psychomotor agitation, psychomotor retardation, recurrent thoughts of death, weight changes.     2023-02-09    3:15 PM 08/31/2021    8:57 AM  GAD 7 : Generalized Anxiety Score  Nervous, Anxious, on Edge 0 0  Control/stop worrying 0 0  Worry too much - different things 0 1  Trouble relaxing 0 0  Restless 0 0  Easily annoyed or irritable 0 0  Afraid - awful might happen 0 0  Total GAD 7 Score 0 1  Anxiety Difficulty Not difficult at all Not difficult at all     ROS Negative unless otherwise noted in HPI   Objective:     BP 122/85 (BP Location: Left Arm, Patient Position: Sitting, Cuff Size: Normal)   Pulse 72   Resp 18   Ht 5' 10"$  (1.778 m)   Wt 138 lb (62.6 kg)   SpO2 100%   BMI 19.80 kg/m   Physical Exam Constitutional:       General: He is not in acute distress.    Appearance: Normal appearance.  HENT:     Head: Normocephalic and atraumatic.  Cardiovascular:     Rate and Rhythm: Normal rate and regular rhythm.     Pulses: Normal pulses.     Heart sounds: Normal heart sounds.  Pulmonary:     Effort: Pulmonary effort is normal.     Breath sounds: Normal breath sounds.  Abdominal:     General: Abdomen is flat. Bowel sounds are normal.     Palpations: Abdomen is soft.     Tenderness: There is no abdominal tenderness. There is no guarding.  Skin:    General: Skin is warm and dry.  Neurological:     Mental Status: He is alert and oriented to person, place, and time.  Psychiatric:        Mood and Affect: Mood normal.     Assessment & Plan:  GAD (generalized anxiety disorder) Assessment & Plan: Stable, GAD-7 score of 0 today.  Continue propranolol twice daily.  Will continue to monitor.  Orders: -     Propranolol HCl; Take 1 tablet (10 mg total) by mouth 2 (two) times daily.  Dispense: 180 tablet; Refill: 1  Gastroesophageal reflux disease, unspecified whether esophagitis present Assessment & Plan: Stable.  Continue Protonix 20 mg daily.  Will continue to monitor.  Orders: -     Pantoprazole Sodium; Take 1 tablet (20 mg total) by mouth daily.  Dispense: 90 tablet; Refill: 1   Return in about 7 months (  around 08/23/2023) for annual physical, fasting blood work 1 week before.    Velva Harman, PA

## 2023-08-07 ENCOUNTER — Other Ambulatory Visit: Payer: Self-pay

## 2023-08-07 DIAGNOSIS — Z0000 Encounter for general adult medical examination without abnormal findings: Secondary | ICD-10-CM

## 2023-08-18 ENCOUNTER — Other Ambulatory Visit: Payer: Self-pay | Admitting: Family Medicine

## 2023-08-18 ENCOUNTER — Other Ambulatory Visit: Payer: BC Managed Care – PPO

## 2023-08-18 DIAGNOSIS — Z0000 Encounter for general adult medical examination without abnormal findings: Secondary | ICD-10-CM

## 2023-08-18 DIAGNOSIS — K219 Gastro-esophageal reflux disease without esophagitis: Secondary | ICD-10-CM

## 2023-08-18 DIAGNOSIS — R7301 Impaired fasting glucose: Secondary | ICD-10-CM | POA: Diagnosis not present

## 2023-08-19 LAB — LIPID PANEL
Chol/HDL Ratio: 8.7 ratio — ABNORMAL HIGH (ref 0.0–5.0)
Cholesterol, Total: 295 mg/dL — ABNORMAL HIGH (ref 100–199)
HDL: 34 mg/dL — ABNORMAL LOW (ref >39)
LDL Chol Calc (NIH): 216 mg/dL — ABNORMAL HIGH (ref 0–99)
Triglycerides: 228 mg/dL — ABNORMAL HIGH (ref 0–149)
VLDL Cholesterol Cal: 45 mg/dL — ABNORMAL HIGH (ref 5–40)

## 2023-08-19 LAB — COMPREHENSIVE METABOLIC PANEL
ALT: 18 IU/L (ref 0–44)
AST: 14 IU/L (ref 0–40)
Albumin: 4.7 g/dL (ref 4.1–5.1)
Alkaline Phosphatase: 43 IU/L — ABNORMAL LOW (ref 44–121)
BUN/Creatinine Ratio: 17 (ref 9–20)
BUN: 18 mg/dL (ref 6–24)
Bilirubin Total: 0.6 mg/dL (ref 0.0–1.2)
CO2: 26 mmol/L (ref 20–29)
Calcium: 9.9 mg/dL (ref 8.7–10.2)
Chloride: 102 mmol/L (ref 96–106)
Creatinine, Ser: 1.03 mg/dL (ref 0.76–1.27)
Globulin, Total: 2.4 g/dL (ref 1.5–4.5)
Glucose: 97 mg/dL (ref 70–99)
Potassium: 4.2 mmol/L (ref 3.5–5.2)
Sodium: 141 mmol/L (ref 134–144)
Total Protein: 7.1 g/dL (ref 6.0–8.5)
eGFR: 91 mL/min/{1.73_m2} (ref >59)

## 2023-08-19 LAB — CBC WITH DIFFERENTIAL/PLATELET
Basophils Absolute: 0 10*3/uL (ref 0.0–0.2)
Basos: 1 %
EOS (ABSOLUTE): 0 10*3/uL (ref 0.0–0.4)
Eos: 1 %
Hematocrit: 46.5 % (ref 37.5–51.0)
Hemoglobin: 15.8 g/dL (ref 13.0–17.7)
Immature Grans (Abs): 0 10*3/uL (ref 0.0–0.1)
Immature Granulocytes: 0 %
Lymphocytes Absolute: 1.4 10*3/uL (ref 0.7–3.1)
Lymphs: 35 %
MCH: 30.8 pg (ref 26.6–33.0)
MCHC: 34 g/dL (ref 31.5–35.7)
MCV: 91 fL (ref 79–97)
Monocytes Absolute: 0.3 10*3/uL (ref 0.1–0.9)
Monocytes: 7 %
Neutrophils Absolute: 2.2 10*3/uL (ref 1.4–7.0)
Neutrophils: 56 %
Platelets: 249 10*3/uL (ref 150–450)
RBC: 5.13 x10E6/uL (ref 4.14–5.80)
RDW: 11.8 % (ref 11.6–15.4)
WBC: 4 10*3/uL (ref 3.4–10.8)

## 2023-08-19 LAB — HEMOGLOBIN A1C
Est. average glucose Bld gHb Est-mCnc: 108 mg/dL
Hgb A1c MFr Bld: 5.4 % (ref 4.8–5.6)

## 2023-08-19 LAB — TSH: TSH: 2.95 u[IU]/mL (ref 0.450–4.500)

## 2023-08-25 ENCOUNTER — Encounter: Payer: BC Managed Care – PPO | Admitting: Family Medicine

## 2023-09-03 ENCOUNTER — Encounter: Payer: BC Managed Care – PPO | Admitting: Family Medicine

## 2023-11-13 ENCOUNTER — Other Ambulatory Visit: Payer: Self-pay | Admitting: Family Medicine

## 2023-11-13 DIAGNOSIS — F411 Generalized anxiety disorder: Secondary | ICD-10-CM

## 2023-11-21 ENCOUNTER — Other Ambulatory Visit: Payer: Self-pay | Admitting: Family Medicine

## 2023-11-21 DIAGNOSIS — F411 Generalized anxiety disorder: Secondary | ICD-10-CM

## 2023-12-04 NOTE — Addendum Note (Signed)
 Addended by: Saralyn Pilar on: 12/04/2023 02:03 PM   Modules accepted: Orders

## 2023-12-04 NOTE — Telephone Encounter (Signed)
 Copied from CRM [id number]. Topic: Clinical - Medication Refill >> Dec 04, 2023 12:30 PM Laurier BROCKS wrote: Most Recent Primary Care Visit:  Provider: [facility]  Department: [facility]  Visit Type: LAB  Date: 08/18/2023  Medication: propranolol  (INDERAL ) 10 MG tablet  Has the patient contacted their pharmacy? Yes (Agent: If no, request that the patient contact the pharmacy for the refill. If patient does not wish to contact the pharmacy document the reason why and proceed with request.) (Agent: If yes, when and what did the pharmacy advise?)  Is this the correct pharmacy for this prescription? Yes If no, delete pharmacy and type the correct one.  This is the patient's preferred pharmacy:  [facility] - [place], [place] - [address] [place] [address] Phone: [phone] Fax: [phone]   Has the prescription been filled recently? Yes  Is the patient out of the medication? Yes  Has the patient been seen for an appointment in the last year OR does the patient have an upcoming appointment? Yes  Can we respond through MyChart? Yes  Agent: Please be advised that Rx refills may take up to 3 business days. We ask that you follow-up with your pharmacy.

## 2023-12-16 ENCOUNTER — Encounter: Payer: Self-pay | Admitting: Family Medicine

## 2023-12-16 ENCOUNTER — Ambulatory Visit: Payer: BC Managed Care – PPO | Admitting: Family Medicine

## 2023-12-16 VITALS — BP 122/84 | HR 82 | Temp 98.3°F | Ht 70.0 in | Wt 154.0 lb

## 2023-12-16 DIAGNOSIS — F411 Generalized anxiety disorder: Secondary | ICD-10-CM

## 2023-12-16 DIAGNOSIS — K219 Gastro-esophageal reflux disease without esophagitis: Secondary | ICD-10-CM | POA: Diagnosis not present

## 2023-12-16 DIAGNOSIS — E782 Mixed hyperlipidemia: Secondary | ICD-10-CM | POA: Diagnosis not present

## 2023-12-16 MED ORDER — PROPRANOLOL HCL 10 MG PO TABS
10.0000 mg | ORAL_TABLET | Freq: Two times a day (BID) | ORAL | 1 refills | Status: DC
Start: 2023-12-16 — End: 2024-10-07

## 2023-12-16 MED ORDER — PANTOPRAZOLE SODIUM 20 MG PO TBEC: 20.0000 mg | DELAYED_RELEASE_TABLET | Freq: Every day | ORAL | 1 refills | Status: DC

## 2023-12-16 NOTE — Assessment & Plan Note (Signed)
Stable, GAD-7 score of 0 today.  Continue propranolol twice daily.  Will continue to monitor.

## 2023-12-16 NOTE — Progress Notes (Signed)
 Established Patient Office Visit  Subjective   Patient ID: Seth Chavez, male    DOB: 07-05-1976  Age: 48 y.o. MRN: [id number]  Chief Complaint  Patient presents with   Medication Refill    HPI Seth Chavez is a 48 y.o. male presenting today for follow up of anxiety, currently managing with propranolol  10 mg twice daily. Taking medication without side effects, reports excellent compliance with treatment. Denies mood changes or SI/HI. He feels mood is stable since last visit.     12/16/2023    9:36 AM 01/22/2023    3:15 PM 08/31/2021    8:57 AM  Depression screen PHQ 2/9  Decreased Interest 0 0 0  Down, Depressed, Hopeless 0 0 0  PHQ - 2 Score 0 0 0  Altered sleeping 0 0 0  Tired, decreased energy 0 0 0  Change in appetite 0 0 0  Feeling bad or failure about yourself  0 0 0  Trouble concentrating 0 0 0  Moving slowly or fidgety/restless 0 0 0  Suicidal thoughts 0 0 0  PHQ-9 Score 0 0 0  Difficult doing work/chores  Not difficult at all Not difficult at all       12/16/2023    9:36 AM 01/22/2023    3:15 PM 08/31/2021    8:57 AM  GAD 7 : Generalized Anxiety Score  Nervous, Anxious, on Edge 0 0 0  Control/stop worrying 0 0 0  Worry too much - different things 0 0 1  Trouble relaxing 0 0 0  Restless 0 0 0  Easily annoyed or irritable 0 0 0  Afraid - awful might happen 0 0 0  Total GAD 7 Score 0 0 1  Anxiety Difficulty  Not difficult at all Not difficult at all     Outpatient Medications Prior to Visit  Medication Sig   Omega-3 Fatty Acids (FISH OIL) 1000 MG CAPS Take 3,000 mg by mouth daily.   vitamin B-12 (CYANOCOBALAMIN) 1000 MCG tablet Take 1,000 mcg by mouth daily.   [DISCONTINUED] pantoprazole  (PROTONIX ) 20 MG tablet TAKE 1 TABLET (20 MG TOTAL) BY MOUTH DAILY.   [DISCONTINUED] propranolol  (INDERAL ) 10 MG tablet Take 1 tablet (10 mg total) by mouth 2 (two) times daily.   No facility-administered medications prior to visit.    ROS Negative unless otherwise  noted in HPI   Objective:     BP 122/84   Pulse 82   Temp 98.3 F (36.8 C) (Oral)   Ht 5' 10 (1.778 m)   Wt 154 lb (69.9 kg)   SpO2 98%   BMI 22.10 kg/m   Physical Exam Vitals reviewed.  Constitutional:      General: He is not in acute distress.    Appearance: Normal appearance. He is not ill-appearing.  HENT:     Head: Normocephalic and atraumatic.     Nose: Nose normal.  Eyes:     Conjunctiva/sclera: Conjunctivae normal.  Pulmonary:     Effort: Pulmonary effort is normal.  Musculoskeletal:     Cervical back: Normal range of motion.  Skin:    Coloration: Skin is not jaundiced or pale.     Findings: No bruising.  Neurological:     Mental Status: He is alert and oriented to person, place, and time.  Psychiatric:        Mood and Affect: Mood normal.      Assessment & Plan:  GAD (generalized anxiety disorder) Assessment & Plan: Stable, GAD-7 score of  0 today.  Continue propranolol  twice daily.  Will continue to monitor.  Orders: -     Propranolol  HCl; Take 1 tablet (10 mg total) by mouth 2 (two) times daily.  Dispense: 180 tablet; Refill: 1  Gastroesophageal reflux disease, unspecified whether esophagitis present Assessment & Plan: Stable.  Continue Protonix  20 mg daily.  Recheck vitamin B12 with labs in 4 months.  Will continue to monitor.  Orders: -     Pantoprazole  Sodium; Take 1 tablet (20 mg total) by mouth daily.  Dispense: 90 tablet; Refill: 1  Mixed hyperlipidemia Assessment & Plan: Last lipid panel: LDL 216, HDL 34, triglycerides 228. The 10-year ASCVD risk score (Arnett DK, et al., 2019) is: 6.2%.  He plans on implementing nutritional and physical activity changes, though it is a challenge because he travels so much.  Recheck lipid panel in 4 months, if cholesterol remains elevated suspect familial hypercholesterolemia and recommend statin therapy.     Return in about 4 months (around 04/14/2024) for annual physical, fasting labs 1 week before.     Joesph DELENA Sear, PA

## 2023-12-16 NOTE — Assessment & Plan Note (Addendum)
 Last lipid panel: LDL 216, HDL 34, triglycerides 228. The 10-year ASCVD risk score (Arnett DK, et al., 2019) is: 6.2%.  He plans on implementing nutritional and physical activity changes, though it is a challenge because he travels so much.  Recheck lipid panel in 4 months, if cholesterol remains elevated suspect familial hypercholesterolemia and recommend statin therapy.

## 2023-12-16 NOTE — Assessment & Plan Note (Addendum)
 Stable.  Continue Protonix 20 mg daily.  Recheck vitamin B12 with labs in 4 months.  Will continue to monitor.

## 2024-01-08 ENCOUNTER — Encounter: Payer: Self-pay | Admitting: Family Medicine

## 2024-04-16 ENCOUNTER — Other Ambulatory Visit: Payer: BC Managed Care – PPO

## 2024-04-23 ENCOUNTER — Encounter: Payer: BC Managed Care – PPO | Admitting: Family Medicine

## 2024-09-02 ENCOUNTER — Other Ambulatory Visit: Payer: Self-pay | Admitting: Family Medicine

## 2024-09-02 DIAGNOSIS — K219 Gastro-esophageal reflux disease without esophagitis: Secondary | ICD-10-CM

## 2024-09-02 NOTE — Telephone Encounter (Signed)
 Copied from CRM [id number]. Topic: Clinical - Medication Refill >> Sep 02, 2024 12:45 PM Darshell M wrote: Medication:  pantoprazole  (PROTONIX ) 20 MG tablet   Has the patient contacted their pharmacy? No (Agent: If no, request that the patient contact the pharmacy for the refill. If patient does not wish to contact the pharmacy document the reason why and proceed with request.) (Agent: If yes, when and what did the pharmacy advise?)  This is the patient's preferred pharmacy:  [facility] - [place], [place] - [address] [place] [address] Phone: [phone] Fax: [phone]  Is this the correct pharmacy for this prescription? Yes If no, delete pharmacy and type the correct one.   Has the prescription been filled recently? No  Is the patient out of the medication? Yes  Has the patient been seen for an appointment in the last year OR does the patient have an upcoming appointment? Yes  Can we respond through MyChart? Yes  Agent: Please be advised that Rx refills may take up to 3 business days. We ask that you follow-up with your pharmacy.

## 2024-09-09 ENCOUNTER — Other Ambulatory Visit: Payer: Self-pay | Admitting: Family Medicine

## 2024-09-09 DIAGNOSIS — K219 Gastro-esophageal reflux disease without esophagitis: Secondary | ICD-10-CM

## 2024-09-09 NOTE — Telephone Encounter (Signed)
 Patient called.

## 2024-10-07 ENCOUNTER — Ambulatory Visit (INDEPENDENT_AMBULATORY_CARE_PROVIDER_SITE_OTHER)

## 2024-10-07 VITALS — BP 126/84 | HR 65 | Temp 98.2°F | Ht 70.0 in | Wt 141.1 lb

## 2024-10-07 DIAGNOSIS — K219 Gastro-esophageal reflux disease without esophagitis: Secondary | ICD-10-CM

## 2024-10-07 DIAGNOSIS — Z1211 Encounter for screening for malignant neoplasm of colon: Secondary | ICD-10-CM | POA: Diagnosis not present

## 2024-10-07 DIAGNOSIS — F1010 Alcohol abuse, uncomplicated: Secondary | ICD-10-CM

## 2024-10-07 DIAGNOSIS — F411 Generalized anxiety disorder: Secondary | ICD-10-CM

## 2024-10-07 DIAGNOSIS — E782 Mixed hyperlipidemia: Secondary | ICD-10-CM

## 2024-10-07 DIAGNOSIS — Z1329 Encounter for screening for other suspected endocrine disorder: Secondary | ICD-10-CM

## 2024-10-07 DIAGNOSIS — Z0000 Encounter for general adult medical examination without abnormal findings: Secondary | ICD-10-CM | POA: Insufficient documentation

## 2024-10-07 DIAGNOSIS — Z1321 Encounter for screening for nutritional disorder: Secondary | ICD-10-CM

## 2024-10-07 DIAGNOSIS — Z130 Encounter for screening for diseases of the blood and blood-forming organs and certain disorders involving the immune mechanism: Secondary | ICD-10-CM

## 2024-10-07 DIAGNOSIS — Z13228 Encounter for screening for other metabolic disorders: Secondary | ICD-10-CM

## 2024-10-07 MED ORDER — PROPRANOLOL HCL 10 MG PO TABS: 10.0000 mg | ORAL_TABLET | Freq: Two times a day (BID) | ORAL | 1 refills | Status: AC

## 2024-10-07 MED ORDER — PANTOPRAZOLE SODIUM 20 MG PO TBEC: 20.0000 mg | DELAYED_RELEASE_TABLET | Freq: Every day | ORAL | 1 refills | Status: DC

## 2024-10-07 NOTE — Assessment & Plan Note (Signed)
 Last lipid panel: LDL 216, HDL 34, Trig 228.The 10-year ASCVD risk score (Arnett DK, et al., 2019) is: 7% Rechecking lipid panel today as suspect familial hypercholesterolemia. Also checking LPA and ApoB. Discussed CAC as option pending his lab work. Pending results, highly recommend early statin therapy vs referral to lipid clinic for complex management.

## 2024-10-07 NOTE — Patient Instructions (Signed)
 VISIT SUMMARY: You had your annual physical exam today. We discussed your cholesterol levels, sleep disturbances, and other health concerns. Lab work was ordered, and we talked about preventive care, including scheduling your first colonoscopy.  YOUR PLAN: ADULT WELLNESS VISIT: Routine visit with normal blood pressure. Discussed sleep disturbances related to job change. -Lab work ordered: blood counts, A1c, cholesterol, thyroid, kidney, liver function. -Suggested melatonin and magnesium for sleep disturbances.  HYPERLIPIDEMIA: Chronic high cholesterol levels despite lifestyle changes. -Ordered LPA and ApoB labs. -Consider coronary artery calcium scan if labs are not elevated.  GASTROESOPHAGEAL REFLUX DISEASE: Chronic GERD managed with pantoprazole . Symptoms improved with lifestyle changes. -Prescribed pantoprazole    SCREENING FOR MALIGNANT NEOPLASM OF COLON: Due for initial colonoscopy. -Referred to [facility] for colonoscopy. -Scheduled preoperative appointment.  ALCOHOL DEPENDENCE, IN REMISSION: In remission for 5.5 years. Continues propranolol  for management. -Continue propranolol  10 mg twice daily. -Refilled propranolol  prescription.  If you have any problems before your next visit feel free to message me via MyChart (minor issues or questions) or call the office, otherwise you may reach out to schedule an office visit.  Thank you! Saddie Sacks, PA-C

## 2024-10-07 NOTE — Assessment & Plan Note (Signed)
Stable.  Continue Protonix 20 mg daily. 

## 2024-10-07 NOTE — Assessment & Plan Note (Signed)
 In remission for 5.5 years. Congratulated him on his efforts. Continue abstinence from ETOH. Continues propranolol  for management. - Continue propranolol  10 mg twice daily. - Refilled propranolol  prescription.

## 2024-10-07 NOTE — Assessment & Plan Note (Signed)
Stable, GAD-7 score of 0 today.  Continue propranolol twice daily.  Will continue to monitor.

## 2024-10-07 NOTE — Progress Notes (Signed)
 Complete physical exam  Patient: Seth Chavez   DOB: Jul 06, 1976   48 y.o. Male  MRN: [id number]  Subjective:    Chief Complaint  Patient presents with   Annual Exam    Transfer of Care    History of Present Illness   Seth Chavez is a 48 year old male who presents for an annual physical exam and lab work.  Hyperlipidemia - Significantly elevated cholesterol levels over the past few years despite dietary modifications and increased physical activity.  Alcohol use disorder, in remission - Sober for five and a half years. - Currently taking propranolol  10 mg twice daily, initially prescribed during alcohol recovery. - Tolerates propranolol  well.  Gastroesophageal reflux symptoms - Uses pantoprazole  as needed, approximately every other day. - Significant improvement in reflux symptoms with current regimen.  Sleep disturbance - Occasional sleep disturbances attributed to stress from recent job changes. - Previously used trazodone  during treatment but no longer using any sleep aids.  Tobacco use - Former smoker. - No tobacco use in many years.  Preventive care - Received influenza vaccination a couple of weeks ago. - Up to date on other vaccinations, including tetanus.          Most recent fall risk assessment:    10/07/2024    8:53 AM  Fall Risk   Falls in the past year? 0  Follow up Falls evaluation completed     Most recent depression screenings:    10/07/2024    8:53 AM 12/16/2023    9:36 AM  PHQ 2/9 Scores  PHQ - 2 Score 0 0  PHQ- 9 Score 0 0    Vision:Within last year and Dental: No current dental problems and Receives regular dental care    Patient Care Team: Gayle Saddie JULIANNA DEVONNA as PCP - General (Physician Assistant)   Outpatient Medications Prior to Visit  Medication Sig   Omega-3 Fatty Acids (FISH OIL) 1000 MG CAPS Take 3,000 mg by mouth daily.   vitamin B-12 (CYANOCOBALAMIN) 1000 MCG tablet Take 1,000 mcg by mouth daily.   [DISCONTINUED]  pantoprazole  (PROTONIX ) 20 MG tablet TAKE 1 TABLET (20 MG TOTAL) BY MOUTH DAILY.   [DISCONTINUED] propranolol  (INDERAL ) 10 MG tablet Take 1 tablet (10 mg total) by mouth 2 (two) times daily.   No facility-administered medications prior to visit.    ROS   Per HPI     Objective:     BP 126/84   Pulse 65   Temp 98.2 F (36.8 C) (Oral)   Ht 5' 10 (1.778 m)   Wt 141 lb 1.9 oz (64 kg)   SpO2 99%   BMI 20.25 kg/m    Physical Exam Constitutional:      General: He is not in acute distress.    Appearance: Normal appearance.  Cardiovascular:     Rate and Rhythm: Normal rate and regular rhythm.     Heart sounds: Normal heart sounds. No murmur heard.    No friction rub. No gallop.  Pulmonary:     Effort: Pulmonary effort is normal. No respiratory distress.     Breath sounds: Normal breath sounds.  Musculoskeletal:        General: No swelling.     Cervical back: Neck supple.  Lymphadenopathy:     Cervical: No cervical adenopathy.  Skin:    General: Skin is warm and dry.  Neurological:     General: No focal deficit present.     Mental Status: He is alert.  Psychiatric:        Mood and Affect: Mood normal.        Behavior: Behavior normal.        Thought Content: Thought content normal.       No results found for any visits on 10/07/24. Last CBC Lab Results  Component Value Date   WBC 4.0 08/18/2023   HGB 15.8 08/18/2023   HCT 46.5 08/18/2023   MCV 91 08/18/2023   MCH 30.8 08/18/2023   RDW 11.8 08/18/2023   PLT 249 08/18/2023   Last metabolic panel Lab Results  Component Value Date   GLUCOSE 97 08/18/2023   NA 141 08/18/2023   K 4.2 08/18/2023   CL 102 08/18/2023   CO2 26 08/18/2023   BUN 18 08/18/2023   CREATININE 1.03 08/18/2023   EGFR 91 08/18/2023   CALCIUM 9.9 08/18/2023   PROT 7.1 08/18/2023   ALBUMIN 4.7 08/18/2023   LABGLOB 2.4 08/18/2023   AGRATIO 2.1 08/31/2021   BILITOT 0.6 08/18/2023   ALKPHOS 43 (L) 08/18/2023   AST 14 08/18/2023    ALT 18 08/18/2023   ANIONGAP 17 (H) 02/22/2019   Last lipids Lab Results  Component Value Date   CHOL 295 (H) 08/18/2023   HDL 34 (L) 08/18/2023   LDLCALC 216 (H) 08/18/2023   TRIG 228 (H) 08/18/2023   CHOLHDL 8.7 (H) 08/18/2023   Last hemoglobin A1c Lab Results  Component Value Date   HGBA1C 5.4 08/18/2023   Last thyroid functions Lab Results  Component Value Date   TSH 2.950 08/18/2023   Last vitamin D No results found for: 25OHVITD2, 25OHVITD3, VD25OH      Assessment & Plan:    Routine Health Maintenance and Physical Exam  Health Maintenance  Topic Date Due   Hepatitis B Vaccine (1 of 3 - 19+ 3-dose series) Never done   Colon Cancer Screening  Never done   COVID-19 Vaccine (5 - 2025-26 season) 08/02/2024   DTaP/Tdap/Td vaccine (2 - Td or Tdap) 09/01/2031   Flu Shot  Completed   Hepatitis C Screening  Completed   HIV Screening  Completed   Pneumococcal Vaccine  Aged Out   HPV Vaccine  Aged Out   Meningitis B Vaccine  Aged Out    Discussed health benefits of physical activity, and encouraged him to engage in regular exercise appropriate for his age and condition.  Screen for colon cancer -     Ambulatory referral to Gastroenterology  GAD (generalized anxiety disorder) Assessment & Plan: Stable, GAD-7 score of 0 today.  Continue propranolol  twice daily.  Will continue to monitor.  Orders: -     Propranolol  HCl; Take 1 tablet (10 mg total) by mouth 2 (two) times daily.  Dispense: 62 tablet; Refill: 1  Gastroesophageal reflux disease, unspecified whether esophagitis present Assessment & Plan: Stable. Continue Protonix  20 mg daily.   Orders: -     Pantoprazole  Sodium; Take 1 tablet (20 mg total) by mouth daily.  Dispense: 31 tablet; Refill: 1  Mixed hyperlipidemia Assessment & Plan: Last lipid panel: LDL 216, HDL 34, Trig 228.The 10-year ASCVD risk score (Arnett DK, et al., 2019) is: 7% Rechecking lipid panel today as suspect familial  hypercholesterolemia. Also checking LPA and ApoB. Discussed CAC as option pending his lab work. Pending results, highly recommend early statin therapy vs referral to lipid clinic for complex management.  Orders: -     Lipid panel; Future -     Comprehensive metabolic panel with GFR; Future -  Lipoprotein A (LPA); Future -     Apolipoprotein B; Future  Screening for endocrine, nutritional, metabolic and immunity disorder -     VITAMIN D 25 Hydroxy (Vit-D Deficiency, Fractures); Future -     TSH; Future -     Hemoglobin A1c; Future -     CBC with Differential/Platelet; Future  Alcohol abuse Assessment & Plan: In remission for 5.5 years. Congratulated him on his efforts. Continue abstinence from ETOH. Continues propranolol  for management. - Continue propranolol  10 mg twice daily. - Refilled propranolol  prescription.    Wellness examination Assessment & Plan: Routine visit with normal blood pressure. Discussed sleep disturbances related to job change. - Ordered lab work: blood counts, A1c, cholesterol, thyroid, kidney, liver function. - Suggested melatonin and magnesium for sleep disturbances. - Colonoscopy order placed today  - UTD on all other screenings      Return in about 6 months (around 04/06/2025) for HLD.     Saddie JULIANNA Sacks, PA-C

## 2024-10-07 NOTE — Assessment & Plan Note (Signed)
 Routine visit with normal blood pressure. Discussed sleep disturbances related to job change. - Ordered lab work: blood counts, A1c, cholesterol, thyroid, kidney, liver function. - Suggested melatonin and magnesium for sleep disturbances. - Colonoscopy order placed today  - UTD on all other screenings

## 2024-10-08 ENCOUNTER — Ambulatory Visit: Payer: Self-pay

## 2024-10-08 DIAGNOSIS — E782 Mixed hyperlipidemia: Secondary | ICD-10-CM

## 2024-10-08 LAB — CBC WITH DIFFERENTIAL/PLATELET
Basophils Absolute: 0 x10E3/uL (ref 0.0–0.2)
Basos: 1 %
EOS (ABSOLUTE): 0 x10E3/uL (ref 0.0–0.4)
Eos: 1 %
Hematocrit: 46.2 % (ref 37.5–51.0)
Hemoglobin: 15.4 g/dL (ref 13.0–17.7)
Immature Grans (Abs): 0 x10E3/uL (ref 0.0–0.1)
Immature Granulocytes: 0 %
Lymphocytes Absolute: 1.6 x10E3/uL (ref 0.7–3.1)
Lymphs: 38 %
MCH: 30.1 pg (ref 26.6–33.0)
MCHC: 33.3 g/dL (ref 31.5–35.7)
MCV: 90 fL (ref 79–97)
Monocytes Absolute: 0.3 x10E3/uL (ref 0.1–0.9)
Monocytes: 8 %
Neutrophils Absolute: 2.2 x10E3/uL (ref 1.4–7.0)
Neutrophils: 52 %
Platelets: 291 x10E3/uL (ref 150–450)
RBC: 5.12 x10E6/uL (ref 4.14–5.80)
RDW: 12.2 % (ref 11.6–15.4)
WBC: 4.2 x10E3/uL (ref 3.4–10.8)

## 2024-10-08 LAB — COMPREHENSIVE METABOLIC PANEL WITH GFR
ALT: 13 IU/L (ref 0–44)
AST: 12 IU/L (ref 0–40)
Albumin: 4.7 g/dL (ref 4.1–5.1)
Alkaline Phosphatase: 41 IU/L — ABNORMAL LOW (ref 47–123)
BUN/Creatinine Ratio: 23 — ABNORMAL HIGH (ref 9–20)
BUN: 22 mg/dL (ref 6–24)
Bilirubin Total: 0.5 mg/dL (ref 0.0–1.2)
CO2: 26 mmol/L (ref 20–29)
Calcium: 10.1 mg/dL (ref 8.7–10.2)
Chloride: 99 mmol/L (ref 96–106)
Creatinine, Ser: 0.96 mg/dL (ref 0.76–1.27)
Globulin, Total: 2.4 g/dL (ref 1.5–4.5)
Glucose: 97 mg/dL (ref 70–99)
Potassium: 4.6 mmol/L (ref 3.5–5.2)
Sodium: 139 mmol/L (ref 134–144)
Total Protein: 7.1 g/dL (ref 6.0–8.5)
eGFR: 98 mL/min/1.73 (ref >59)

## 2024-10-08 LAB — HEMOGLOBIN A1C
Est. average glucose Bld gHb Est-mCnc: 105 mg/dL
Hgb A1c MFr Bld: 5.3 % (ref 4.8–5.6)

## 2024-10-08 LAB — LIPID PANEL
Chol/HDL Ratio: 8.3 ratio — ABNORMAL HIGH (ref 0.0–5.0)
Cholesterol, Total: 308 mg/dL — ABNORMAL HIGH (ref 100–199)
HDL: 37 mg/dL — ABNORMAL LOW (ref >39)
LDL Chol Calc (NIH): 237 mg/dL — ABNORMAL HIGH (ref 0–99)
Triglycerides: 175 mg/dL — ABNORMAL HIGH (ref 0–149)
VLDL Cholesterol Cal: 34 mg/dL (ref 5–40)

## 2024-10-08 LAB — LIPOPROTEIN A (LPA): Lipoprotein (a): 51.5 nmol/L (ref <75.0)

## 2024-10-08 LAB — VITAMIN D 25 HYDROXY (VIT D DEFICIENCY, FRACTURES): Vit D, 25-Hydroxy: 18.2 ng/mL — ABNORMAL LOW (ref 30.0–100.0)

## 2024-10-08 LAB — TSH: TSH: 2.99 u[IU]/mL (ref 0.450–4.500)

## 2024-10-08 LAB — APOLIPOPROTEIN B: Apolipoprotein B: 177 mg/dL — ABNORMAL HIGH (ref <90)

## 2024-10-08 MED ORDER — ROSUVASTATIN CALCIUM 5 MG PO TABS: 5.0000 mg | ORAL_TABLET | Freq: Every day | ORAL | 3 refills | Status: AC

## 2024-12-08 ENCOUNTER — Other Ambulatory Visit: Payer: Self-pay

## 2024-12-08 DIAGNOSIS — K219 Gastro-esophageal reflux disease without esophagitis: Secondary | ICD-10-CM

## 2025-03-30 ENCOUNTER — Other Ambulatory Visit

## 2025-04-06 ENCOUNTER — Ambulatory Visit
